# Patient Record
Sex: Female | Born: 1950
Health system: Southern US, Community
[De-identification: ages and names within clinical notes are randomized; demographics above are authoritative.]

## PROBLEM LIST (undated history)

## (undated) DIAGNOSIS — T7840XA Allergy, unspecified, initial encounter: Secondary | ICD-10-CM

## (undated) DIAGNOSIS — F32A Depression, unspecified: Secondary | ICD-10-CM

## (undated) DIAGNOSIS — K589 Irritable bowel syndrome without diarrhea: Secondary | ICD-10-CM

## (undated) DIAGNOSIS — K635 Polyp of colon: Secondary | ICD-10-CM

## (undated) DIAGNOSIS — F419 Anxiety disorder, unspecified: Secondary | ICD-10-CM

## (undated) DIAGNOSIS — M1990 Unspecified osteoarthritis, unspecified site: Secondary | ICD-10-CM

## (undated) DIAGNOSIS — F329 Major depressive disorder, single episode, unspecified: Secondary | ICD-10-CM

## (undated) DIAGNOSIS — K5790 Diverticulosis of intestine, part unspecified, without perforation or abscess without bleeding: Secondary | ICD-10-CM

## (undated) DIAGNOSIS — I213 ST elevation (STEMI) myocardial infarction of unspecified site: Secondary | ICD-10-CM

## (undated) DIAGNOSIS — K219 Gastro-esophageal reflux disease without esophagitis: Secondary | ICD-10-CM

## (undated) HISTORY — PX: PARTIAL HYSTERECTOMY: SHX80

## (undated) HISTORY — PX: CHOLECYSTECTOMY: SHX55

## (undated) HISTORY — DX: Depression, unspecified: F32.A

## (undated) HISTORY — DX: Major depressive disorder, single episode, unspecified: F32.9

## (undated) HISTORY — DX: Polyp of colon: K63.5

## (undated) HISTORY — DX: Diverticulosis of intestine, part unspecified, without perforation or abscess without bleeding: K57.90

## (undated) HISTORY — DX: Gastro-esophageal reflux disease without esophagitis: K21.9

## (undated) HISTORY — DX: Allergy, unspecified, initial encounter: T78.40XA

## (undated) HISTORY — PX: UPPER GASTROINTESTINAL ENDOSCOPY: SHX188

## (undated) HISTORY — DX: Irritable bowel syndrome, unspecified: K58.9

## (undated) HISTORY — PX: COLONOSCOPY: SHX174

## (undated) HISTORY — DX: Unspecified osteoarthritis, unspecified site: M19.90

## (undated) HISTORY — PX: KNEE ARTHROSCOPY: SUR90

## (undated) HISTORY — DX: Anxiety disorder, unspecified: F41.9

## (undated) HISTORY — PX: BACK SURGERY: SHX140

---
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
## 1998-10-19 ENCOUNTER — Ambulatory Visit (HOSPITAL_COMMUNITY): Admission: RE | Admit: 1998-10-19 | Discharge: 1998-10-19 | Payer: Self-pay

## 1999-10-23 ENCOUNTER — Other Ambulatory Visit: Admission: RE | Admit: 1999-10-23 | Discharge: 1999-10-23 | Payer: Self-pay | Admitting: Internal Medicine

## 1999-10-23 ENCOUNTER — Encounter (INDEPENDENT_AMBULATORY_CARE_PROVIDER_SITE_OTHER): Payer: Self-pay | Admitting: Specialist

## 1999-11-15 ENCOUNTER — Other Ambulatory Visit: Admission: RE | Admit: 1999-11-15 | Discharge: 1999-11-15 | Payer: Self-pay | Admitting: Gynecology

## 1999-11-28 ENCOUNTER — Emergency Department (HOSPITAL_COMMUNITY): Admission: EM | Admit: 1999-11-28 | Discharge: 1999-11-28 | Payer: Self-pay | Admitting: Emergency Medicine

## 1999-11-28 ENCOUNTER — Encounter: Payer: Self-pay | Admitting: General Surgery

## 1999-11-30 ENCOUNTER — Encounter: Payer: Self-pay | Admitting: General Surgery

## 1999-11-30 ENCOUNTER — Observation Stay (HOSPITAL_COMMUNITY): Admission: EM | Admit: 1999-11-30 | Discharge: 1999-12-01 | Payer: Self-pay | Admitting: Obstetrics and Gynecology

## 2001-01-22 ENCOUNTER — Encounter: Payer: Self-pay | Admitting: Neurosurgery

## 2001-01-22 ENCOUNTER — Inpatient Hospital Stay (HOSPITAL_COMMUNITY): Admission: RE | Admit: 2001-01-22 | Discharge: 2001-01-25 | Payer: Self-pay | Admitting: Neurosurgery

## 2001-02-13 ENCOUNTER — Encounter: Payer: Self-pay | Admitting: Neurosurgery

## 2001-02-13 ENCOUNTER — Encounter: Admission: RE | Admit: 2001-02-13 | Discharge: 2001-02-13 | Payer: Self-pay | Admitting: Neurosurgery

## 2001-05-27 ENCOUNTER — Other Ambulatory Visit: Admission: RE | Admit: 2001-05-27 | Discharge: 2001-05-27 | Payer: Self-pay | Admitting: Family Medicine

## 2001-12-28 ENCOUNTER — Encounter: Payer: Self-pay | Admitting: Internal Medicine

## 2004-03-27 ENCOUNTER — Other Ambulatory Visit: Admission: RE | Admit: 2004-03-27 | Discharge: 2004-03-27 | Payer: Self-pay | Admitting: Family Medicine

## 2008-08-08 ENCOUNTER — Telehealth: Payer: Self-pay | Admitting: Internal Medicine

## 2008-08-09 ENCOUNTER — Ambulatory Visit: Payer: Self-pay | Admitting: Gastroenterology

## 2008-08-09 DIAGNOSIS — K219 Gastro-esophageal reflux disease without esophagitis: Secondary | ICD-10-CM

## 2008-08-09 DIAGNOSIS — R1310 Dysphagia, unspecified: Secondary | ICD-10-CM | POA: Insufficient documentation

## 2008-08-09 DIAGNOSIS — K589 Irritable bowel syndrome without diarrhea: Secondary | ICD-10-CM

## 2008-08-09 DIAGNOSIS — R198 Other specified symptoms and signs involving the digestive system and abdomen: Secondary | ICD-10-CM | POA: Insufficient documentation

## 2008-08-09 DIAGNOSIS — R109 Unspecified abdominal pain: Secondary | ICD-10-CM

## 2008-08-09 DIAGNOSIS — R197 Diarrhea, unspecified: Secondary | ICD-10-CM

## 2008-08-23 ENCOUNTER — Telehealth: Payer: Self-pay | Admitting: Internal Medicine

## 2008-08-23 ENCOUNTER — Ambulatory Visit: Payer: Self-pay | Admitting: Internal Medicine

## 2008-08-23 ENCOUNTER — Encounter: Payer: Self-pay | Admitting: Internal Medicine

## 2008-08-26 ENCOUNTER — Encounter: Payer: Self-pay | Admitting: Internal Medicine

## 2008-12-05 ENCOUNTER — Encounter (INDEPENDENT_AMBULATORY_CARE_PROVIDER_SITE_OTHER): Payer: Self-pay | Admitting: *Deleted

## 2009-02-02 ENCOUNTER — Telehealth: Payer: Self-pay | Admitting: Internal Medicine

## 2009-02-02 DIAGNOSIS — Z860100 Personal history of colon polyps, unspecified: Secondary | ICD-10-CM | POA: Insufficient documentation

## 2009-02-02 DIAGNOSIS — K5730 Diverticulosis of large intestine without perforation or abscess without bleeding: Secondary | ICD-10-CM | POA: Insufficient documentation

## 2009-02-03 ENCOUNTER — Ambulatory Visit: Payer: Self-pay | Admitting: Gastroenterology

## 2009-02-03 DIAGNOSIS — R1032 Left lower quadrant pain: Secondary | ICD-10-CM | POA: Insufficient documentation

## 2009-02-03 LAB — CONVERTED CEMR LAB
Basophils Absolute: 0.1 10*3/uL (ref 0.0–0.1)
Bilirubin Urine: NEGATIVE
Eosinophils Absolute: 0.2 10*3/uL (ref 0.0–0.7)
HCT: 40.9 % (ref 36.0–46.0)
Hemoglobin: 13.7 g/dL (ref 12.0–15.0)
Ketones, ur: NEGATIVE mg/dL
Leukocytes, UA: NEGATIVE
Lymphs Abs: 4.4 10*3/uL — ABNORMAL HIGH (ref 0.7–4.0)
MCHC: 33.4 g/dL (ref 30.0–36.0)
Neutro Abs: 5.4 10*3/uL (ref 1.4–7.7)
RDW: 12.8 % (ref 11.5–14.6)
Urobilinogen, UA: 0.2 (ref 0.0–1.0)

## 2010-05-10 ENCOUNTER — Telehealth: Payer: Self-pay | Admitting: Internal Medicine

## 2010-05-17 ENCOUNTER — Encounter: Payer: Self-pay | Admitting: Physician Assistant

## 2010-05-17 ENCOUNTER — Ambulatory Visit (INDEPENDENT_AMBULATORY_CARE_PROVIDER_SITE_OTHER): Payer: 59 | Admitting: Physician Assistant

## 2010-05-17 DIAGNOSIS — K5900 Constipation, unspecified: Secondary | ICD-10-CM | POA: Insufficient documentation

## 2010-05-17 DIAGNOSIS — K5732 Diverticulitis of large intestine without perforation or abscess without bleeding: Secondary | ICD-10-CM

## 2010-05-17 DIAGNOSIS — F411 Generalized anxiety disorder: Secondary | ICD-10-CM | POA: Insufficient documentation

## 2010-05-17 DIAGNOSIS — K589 Irritable bowel syndrome without diarrhea: Secondary | ICD-10-CM

## 2010-05-17 DIAGNOSIS — K219 Gastro-esophageal reflux disease without esophagitis: Secondary | ICD-10-CM

## 2010-05-18 ENCOUNTER — Telehealth: Payer: Self-pay | Admitting: Physician Assistant

## 2010-05-22 NOTE — Progress Notes (Signed)
Summary: Needs help to get Nexium  Phone Note Call from Patient Call back at Work#[phone] or Cell [phone]   Call For: Dr Juanda Chance Reason for Call: Refill Medication Summary of Call: Has been short of money so her last Nexium refill she only got 15 pills-that was all she could afford. But today when she went to get her refills she says her pharmacy told her she can ONLY do a 90 day supply. she just can not afford that at this time. She says if they had warned her she could of saved up for it. Now she has no pills and cant afford the 90days and really needs medicines. wonder sif we can help her out in any way? Initial call taken by: Leanor Kail [facility],  May 10, 2010 9:37 AM  Follow-up for Phone Call        Attempted work number, it is busy. Attempted cell number. No answer. Left voicemail for patient to call back. Dottie Nelson-Smith CMA Duncan Dull)  May 10, 2010 11:43 AM   Contacted patient @ number provided by female [phone]). Advised patient that we will be glad to provide a limited number of samples of Nexium for her but that if the prescription continues to be too expensive, she will need to call her insurance company to find out what PPI's they prefer and we can send one of those medications for her. Patient states that she has been on the medication for so long, that her and Dr Juanda Chance decided that she should not take any other medications. Again, advised patient that we could not provide samples continuously so she may need to look into alternatives if needed. Patient states that she would like to be "worked in" to Dr Kimberly-Clark schedule Thursday or Friday when she is in town due to constant pain. I have advised her that Dr Juanda Chance is not in the office either of those days but that one of our nurses will discuss our symptoms with her to decide if she needs a sooner appointment than what she already has. samples have been placed at front desk. Follow-up by: Lamona Curl CMA  Duncan Dull),  May 14, 2010 3:35 PM  Additional Follow-up for Phone Call Additional follow up Details #1::        Patient is c/o LLQ tenderness and constipation.  She was treated in 2010 for the same symptoms for diverticulitis.  She states she has a 2 week hx of worsening constipation, gas and LLQ tenderness.  She is not available to be seen until Thursday, she is scehduled to see Mike Gip PA on 05/17/10 10:30 Additional Follow-up by: Darcey Nora RN, CGRN,  May 14, 2010 3:47 PM    New/Updated Medications: NEXIUM 40 MG CPDR (ESOMEPRAZOLE MAGNESIUM) Take 1 tab daily

## 2010-05-22 NOTE — Assessment & Plan Note (Signed)
Summary: Jasmine Stephens   History of Present Illness Visit Type: Follow-up Visit Primary GI MD: Lina Sar MD Primary Provider: Gweneth Dimitri, MD Chief Complaint: Jasmine abdominal pain and tenderness/Stephens History of Present Illness:   PLEASANT 60 YO FEMALE KNOWN TO DR. Juanda Chance WITH HX OF GERD, IBS, DIVERTICULOSIS, COLON POLYPS AND FAMILY HX OF COLON CANCER. SHE WAS LAST SEEN IN 11/10  WITH DIVERTICULITIS. LAST COLONOSCOPY 6/10, ONE POLYP (BENIGN TISSUE).  SHE COMES IN TODAY WITH C/O ONGOING FAIRLY GENERALIZED ABDOMINAL DISCOMFORT, BLOATING, INTERMITTENT CRAMPING AND Stephens  OVER THE PAST FEW MONTHS. SHE HAS ALSO BEEN OFF HER NEXIUM  OVER THE PAST FEW MONTHS BECAUSE HER INSURANCE REQUIRES SHE GET 3 MONTHS WORTH AT A TIME AND IT IS DIFFICULT FOR HER TO COME UP WITH  $120 AT A TIME.  SHE HAS NOTICED MORE LEFT LOWER ABDOMINAL PAIN. NO FEVER. NO MELENA OR HEME.SHE STAYS CONSTIPATED-SYAS SHE HAS SMALL BOWEL MOVEMENTS BUT NEVER FEELS LIKE SHE EVACUATES HER BOWELS. SHE HAS FOUND ALIGN AND ROBINUL HELPFUL IN THE PAST.    GI Review of Systems    Reports abdominal pain.     Location of  Abdominal pain: Jasmine.    Denies acid reflux, belching, bloating, chest pain, dysphagia with liquids, dysphagia with solids, heartburn, loss of appetite, nausea, vomiting, vomiting blood, weight loss, and  weight gain.      Reports change in bowel habits and  Stephens.     Denies anal fissure, black tarry stools, diarrhea, diverticulosis, fecal incontinence, heme positive stool, hemorrhoids, irritable bowel syndrome, jaundice, light color stool, liver problems, rectal bleeding, and  rectal pain. Current Medications (verified): 1)  Nexium 40 Mg Cpdr (Esomeprazole Magnesium) .... Take 1 Tab Daily To 1 Every Other Day 2)  Aleve 220 Mg Tabs (Naproxen Sodium) .Marland Kitchen.. 1 By Mouth Once Daily As Needed 3)  Nexium 40 Mg Cpdr (Esomeprazole Magnesium) .... Take 1 Cap 30 Min Prior To Breakfast 4)   Align 4 Mg Caps (Probiotic Product) .... Take 1 Capsule Daily X 4 Weeks 5)  Cipro 500 Mg Tabs (Ciprofloxacin Hcl) .... Take 1 Tab Twice Daily X 10 Days  Allergies (verified): 1)  ! Penicillin V Potassium (Penicillin V Potassium) 2)  ! Chlorpromazine Hcl (Chlorpromazine Hcl) 3)  ! * Phenothiazine  Past History:  Past Medical History: Irritable bowel syndrome GERD DIVERTICULOSIS COLON POLYPS Anxiety/DEPRESSION  Past Surgical History: Cholecystectomy 2001 Partial hysterectomy 1983 Back surgery 2003 Rt knee surgery 1992 COLONOSCOPY 6/10 EGD 6/10  Family History: Reviewed history from 02/03/2009 and no changes required. Colon cancer-paternal grandmother, aunt Family History of Heart Disease: Mother and Father Conjestive heart failure-Mother MI-Brother, Father, Paternal grandfather  Social History: Reviewed history from 08/09/2008 and no changes required. Occupation:  WORKING [employer] [place] [employer] CUR[employer] Patient has never smoked.  Alcohol Use - yes- rare glass of wine Daily Caffeine Use- coffee, tea Illicit Drug Use - no Patient does not get regular exercise.   Review of Systems       The patient complains of arthritis/joint pain, back pain, and cough.  The patient denies allergy/sinus, anemia, blood in urine, breast changes/lumps, change in vision, confusion, coughing up blood, depression-new, fainting, fatigue, fever, headaches-new, hearing problems, heart murmur, heart rhythm changes, itching, muscle pains/cramps, night sweats, pregnancy symptoms, shortness of breath, sore throat, swelling of feet/legs, swollen lymph glands, thirst - excessive, urination - excessive, vision changes, and voice change.         SEE HPI  Vital Signs:  Patient profile:   60 year old female Height:      66 inches Weight:      212 pounds BMI:     34.34 Pulse rate:   88 / minute Pulse rhythm:   regular BP sitting:   136 / 78  (left arm)  Vitals Entered By: Milford Cage NCMA  (May 17, 2010 10:44 AM)  Physical Exam  General:  Well developed, well nourished, no acute distress. Head:  Normocephalic and atraumatic. Eyes:  PERRLA, no icterus. Lungs:  Clear throughout to auscultation. Heart:  Regular rate and rhythm; no murmurs, rubs,  or bruits. Abdomen:  SOFT, BS+, TENDER Jasmine, AND MILD DIFFUSLEY, NO GUARDING, NO REBOUND, NO MASS OR HSM Rectal:  NOT DONE Extremities:  No clubbing, cyanosis, edema or deformities noted. Neurologic:  Alert and  oriented x4;  grossly normal neurologically. Psych:  Alert and cooperative. Normal mood and affect.anxious.    Impression & Recommendations:  Problem # 1:  ABDOMINAL PAIN-Jasmine (ICD-789.04) Assessment Deteriorated 60 YO FEMALE WITH IBS AND DIVERTICULOSIS WITH SEVERAL WEEK HX OF Jasmine PAIN/TENDERNESS. CHRONIC Stephens, BLOATING. MAY HAVE LOW GRADE DIVERTICULITIS-WILL TREAT WITH CIPRO 500 MG TWICE DAILY X 10 DAYS. DEFINITE IBS OVERLAY- START ALIGN ONE DAILY REFILL ROBINUL FORTE 2 MG 1-2 X DAILY AS NEEDED FOR CRAMPING ADVISED BOWEL PURGE WITH MIRALAX 17 GM-PT TO TAKE 4-5 DOSES OVER 2 HOURS THIS WEEKEND  FOLLOW UP WITH DR. Juanda Chance AS NEEDED  Problem # 2:  ADENOCARCINOMA, COLON, FAMILY HX (ICD-V16.0) Assessment: Comment Only PT DUE FOR COLONOSCOPY 2015  Problem # 3:  COLONIC POLYPS, HYPERPLASTIC, HX OF (ICD-V12.72) Assessment: Comment Only SEE ABOVE  Problem # 4:  GERD (ICD-530.81) Assessment: Improved LESS SYMPTOMATIC SINCE OFF SEVERAL PSYCHOTROPICS  REFILL NEXIUM 40 MG MAY TAKE DAILY OR EVERY OTHER DAY.  Problem # 5:  ANXIETY (ICD-300.00) Assessment: Comment Only  Patient Instructions: 1)  We have sent a prescription for [facility]ipro  to [facility] [place], [place]. 2)  We gave you samples of Align probiotic to take 1 daily x 4 weeks. 3)  We have given you samples of Nexium 40 mg.  4)  We have given you samples of Miralax. Choose a day this weekend that you  can take the Miralax, 4-5 doses. 5)  Put the packet in a glass of  water or juice, gatorade, and drink 1 glass every 15 min.  Do 5-6 packets. 6)  F/U with Dr. Juanda Chance as needed. 7)  Copy sent to : Selena Batten, MD 8)  The medication list was reviewed and reconciled.  All changed / newly prescribed medications were explained.  A complete medication list was provided to the patient / caregiver.  Prescriptions: ROBINUL-FORTE 2 MG TABS (GLYCOPYRROLATE) Take 1 tab twice daily as needed for cramping and spasms  #60 x 1   Entered by:   Lowry Ram NCMA   Authorized by:   Sammuel Cooper PA-c   Signed by:   Lowry Ram NCMA on 05/17/2010   Method used:   [facility] to        [facility]  [address] [id number]       [address]       [place], [place]  [address]       Ph: [phone] or [phone]       Fax: [phone]   RxID:   [id number] CIPRO 500 MG TABS (CIPROFLOXACIN HCL) Take 1 tab twice daily x 10  days  #20 x 0   Entered by:   Lowry Ram NCMA   Authorized by:   Sammuel Cooper PA-c   Signed by:   Lowry Ram NCMA on 05/17/2010   Method used:   [facility]        [facility]  [address] [id number]       [address]       [place], [place]  [address]       Ph: [phone] or [phone]       Fax: [phone]   RxID:   [id number]

## 2010-05-22 NOTE — Progress Notes (Signed)
Summary: Questions  Phone Note Call from Patient Call back at Home Phone [phone] [facility]   Call back at [phone]   Caller: Patient Call For: Mike Gip Reason for Call: Talk to Nurse Summary of Call: Patient has questions about the instructions that you went over with her yesterday Initial call taken by: [place] Johnson,  May 18, 2010 10:13 AM    Additional Follow-up for Phone Call Additional follow up Details #2::    Pt wanted to know if she can eat the day she plans on doing the bowel purge that evening.  I told her she can eat as normal and do the bowlel purge some evening she is goint to be home. I explained again to drink the juice with packet of Miralax we gave her every 15 min , 6 doses.  She said she understood.  I asked her to call us after she does the bowel purge.  Follow-up by: Joselyn Glassman,  May 18, 2010 4:27 PM

## 2010-08-10 NOTE — H&P (Signed)
. [facility]  Patient:    Jasmine Stephens, Jasmine Stephens Visit Number: [id number] MRN: [id number]          Service Type: SUR Location: [id number] Attending Physician:  Barton Fanny Dictated by:   Hewitt Shorts, M.D. Admit Date:  01/22/2001                           History and Physical  HISTORY OF PRESENT ILLNESS:  Patient is a 60 year old right-handed white female who is evaluated for low back and bilateral lower extremity pain secondary to L5-S1 lumbar disk herniation associated with spondylosis and degenerative disk disease.  She has had difficulties with her low back over the past several years.  Current difficulties began about three months ago; it may have begun after a fall in late August.  She has been having difficulties with low back pain and increasing discomfort extending into her lower extremities bilaterally - right worse than left.  She has a lot of discomfort in bed while turning in bed and getting up from bed.  She also finds that she has discomfort and difficulty getting up from a seated position.  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  She is sitting in chairs intended for preschools which are small and low to the ground.  Her symptoms have steadily worsened, particularly for the past month-and-a-half.  She complains of pain in the low back into her lower extremities - right worse than left.  She says the pain is worse in the back than in the lower extremities.  She has some numbness and tingling of the lower extremities in both legs and feet.  She does not describe any specific weakness.  She does describe another problem with numbness and tingling occasionally in the right hand and forearm.  It will occasionally wake her up and she notices it when she is waking up and also when she is writing or using the computer.  She has been treated with  a number of medications including Celebrex, Mobic, Vicodin, and Flexeril, but none of these have really given her any substantial relief.  Patient was studied with MRI scan and neurosurgical evaluation was performed. The patient is now admitted for an L5-S1 decompression and arthrodesis.  PAST MEDICAL HISTORY:  Notable for history of depression, anxiety, and stress. Also, she has hiatal hernia, irritable bowel syndrome, and spastic colon.  She has been followed by Dr. Lina Sar and has had a number of colonoscopies. She does not describe any history of hypertension, myocardial infarction, cancer, stroke, diabetes, or lung disease.  PREVIOUS SURGERY:  Cholecystectomy in September 2001.  Hysterectomy in December 1994 - she underwent a partial vaginal hysterectomy but no oophorectomy.  She has undergone arthroscopic knee surgery in November 1991, and colonoscopies every three years.  ALLERGIES:  PENICILLIN as well as PHENOTHIAZINES.  CURRENT MEDICATIONS:  Wellbutrin, Mobic, Vicodin, Prilosec, Flexeril.  FAMILY HISTORY:  Her father died of heart disease.  Her mother is in fair health at age 25 with heart disease as well.  SOCIAL HISTORY:  Patient is married.  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  She has to travel up to eight schools and sometimes she can be at as many as five schools in a single day.  She carries equipment and supplies in from her  car to each of these schools.  She does not smoke, she drinks rarely.  She denies history of substance abuse.  REVIEW OF SYSTEMS:  Notable for those difficulties described in the history of present illness and past medical history.  She does describe some sinus disease, but her 14-point review of systems sheet is otherwise unremarkable.  PHYSICAL EXAMINATION:  GENERAL:  Patient is a well-developed, well-nourished white female in no acute distress.  VITAL SIGNS:  Temperature 97.4,  pulse 84, blood pressure 103/69, respiratory rate 20.  Height 5 feet 7 inches, weight 196 pounds.  LUNGS:  Clear to auscultation.  She has symmetric respiratory excursion.  HEART:  Regular rate and rhythm, normal S1, S2.  There is no murmur.  ABDOMEN:  Soft, nondistended.  Bowel sounds are present.  EXTREMITIES:  Shows no clubbing, cyanosis, or edema.  MUSCULOSKELETAL:  Shows tenderness to palpation in the lumbosacral junction. Her mobility is fairly good, both in flexion and extension.  Straight leg raising is negative bilaterally.  NEUROLOGIC:  Shows 5/5 strength through the lower extremities including the dorsiflexor, plantar flexor, extensor hallucis longus.  Sensation is intact to pinprick through the lower extremities.  Reflexes are trace to 1 at the quadriceps, minimal at the gastrocnemii; they are symmetric bilaterally.  Toes are downgoing bilaterally.  She has a normal gait and stance.  LABORATORY DATA:  MRI of the lumbar spine shows advanced degenerative disk disease and spondylosis at L5-S1 with desiccation of the disk, loss of disk space height, and modic changes adjacent to the L5-S1 disk level, and a significantly broad-based disk herniation, worse centrally and next most so to the right, and least most so to the left.  The remainder of her disk levels show no significant degenerative changes.  IMPRESSION:  Patient with progressively worsening back and bilateral lower extremity pain - right worse than left, with advanced degenerative disk disease and spondylosis with superimposed disk herniation at L5-S1 with resulting back pain and radiculopathy.  She is neurologically intact.  PLAN:  The patient will be admitted for a bilateral L5-S1 decompression, diskectomy, and posterior lumbar interbody fusion with Synthes Plus spacers and posterolateral arthrodesis with ______ instrumentation, bone graft, and ______.  We discussed the nature of surgery; alternatives to  surgery and typical length of surgery, hospital stay, and overall recuperation and limitations  postoperatively; the need for postoperative mobilization and lumbar corset. We plan to use a CellSaver during the surgery.  We discussed risks of surgery including risk of infection; bleeding; possible need for transfusion; the risk of nerve root dysfunction with pain, weakness, numbness, or paresthesias; the risk of dural tear and CSF leakage; possible need for further surgery; the risks of failure of the arthrodesis; and anesthetic risks of myocardial infarction, stroke, pneumonia, and death.  Understanding all this, she does wish to proceed with surgery and is admitted for such. Dictated by:   Hewitt Shorts, M.D. Attending Physician:  Barton Fanny DD:  01/22/01 TD:  01/22/01 Job: [id number]

## 2010-08-10 NOTE — Discharge Summary (Signed)
[place]  Patient:    PATTIANN, SOLANKI Visit Number: [id number] MRN: [id number]          Service Type: Attending:  Ronaldo Miyamoto L. Franky Macho, M.D. Dictated by:   Mena Goes. Franky Macho, M.D. Adm. Date:  01/22/01 Disc. Date: 01/25/01                             Discharge Summary  ADMITTING DIAGNOSIS:  Low back and bilateral lower extremity pain due to L5-S1 lumbar disc herniation associated with spondylosis and degenerative disc disease.  DISCHARGE DIAGNOSIS:  Low back and bilateral lower extremity pain due to L5-S1 lumbar disc herniation associated with spondylosis and degenerative disc disease.  PROCEDURE:  L5-S1 posterior lumbar antibody fusion with 9 mm Synthes spacers and posterolateral arthrodesis with 90 degrees instrumentation and local Autograft bone marrow aspiration.  COMPLICATIONS:  None.  CONDITION ON DISCHARGE:  Alive and well.  The wound is clean and dry without signs of infection.  HISTORY OF PRESENT ILLNESS:  Ms. Reginia Forts presented with a significant amount of low back and bilateral lower extremity pain of three months duration.  Pain did not improve with conservative measures.  MRI was studied and showed degenerative disc disease at L5-S1.  It was felt that after no improvement with conservative treatment, that she would do best with a bilateral L5-S1 decompression diskectomy and posterior lumbar antibody fusion using posterolateral arthrodesis also for augmentation.  HOSPITAL COURSE:  Ms. Reginia Forts was admitted and underwent an uncomplicated procedure and postoperatively has done quite well.  Her wound has remained clean and dry without signs of infection.  She has normal strength in the lower extremities.  She has been able to void, tolerate a regular diet and walk without great difficulty.  She will be discharged home today.  DISCHARGE MEDICATION:  Vicodin for pain.  FOLLOWUP:  She was instructed to call our office to obtain a return visit  with Dr. Shirlean Kelly.  SPECIAL INSTRUCTIONS:  The patient understands she is not to drive, lift, bend and/or twist.  She can be a passenger in a car, but not to drive for the very least two weeks.  She also understands that she will have some back pain as result of the operation which should improve with time. Dictated by:   Mena Goes. Franky Macho, M.D. Attending:  Mena Goes. Franky Macho, M.D. DD:  01/25/01 TD:  01/27/01 Job: [id number]

## 2010-08-10 NOTE — Op Note (Signed)
[place]  Patient:    Jasmine Stephens, Jasmine Stephens Visit Number: [id number] MRN: [id number]          Service Type: SUR Location: [id number] Attending Physician:  Barton Fanny Dictated by:   Hewitt Shorts, M.D. Proc. Date: 01/22/01 Admit Date:  01/22/2001                             Operative Report  PREOPERATIVE DIAGNOSIS:  L5-S1 lumbar disk herniation, spondylosis and degenerative disk disease.  POSTOPERATIVE DIAGNOSIS:  L5-S1 lumbar disk herniation, spondylosis, and degenerative disk disease.  OPERATION:  Bilateral L5-S1 lumbar laminotomy, facetectomy and microdiskectomy with microdissection.  Posterior lumbar interbody fusion with 9 mm Synthes PLIF spacers and posterior lateral arthrodesis with 90-D instrumentation, local morcellized autograft, Vitoss and bone marrow aspirates.  SURGEON:  Hewitt Shorts, M.D.  ASSISTANT:  Stefani Dama, M.D.  ANESTHESIA:  General endotracheal.  INDICATIONS: The patient is a 60 year old woman who presented with low back and bilateral lower extremity pain right worse than left who was found to have a central disk herniation at L5-S1 projecting slightly more right than to the left, superimposed upon advanced degenerative disk disease and spondylosis.  A decision was made to proceed with decompression and arthrodesis.  DESCRIPTION OF PROCEDURE: The patient was brought to the operating room and placed under general endotracheal anesthesia.  The patient was turned to a prone position.  The lumbar region was prepped with Betadine soap and solution and draped in sterile fashion.  The midline was infiltrated with a local anesthetic with epinephrine.  An x-ray was taken and the L5-S1 level was identified.  A midline incision was made over the L5-S1 level and carried down to the subcutaneous tissue.  Bipolar cautery and electrocautery were used to maintain hemostasis.  Dissection was carried down to  the lumbar fascia which was incised bilaterally and the paraspinal muscles were dissected from the paraspinous process and lamina in a subperiosteal fashion.  The L5-S1 level was identified with x-ray identification.  Bilateral laminotomies and fasciectomies were performed using Kerrison punches and double action rongeurs.  The ligament of flavum was carefully removed and the thecal sac and S1 nerve roots were identified bilaterally.  The microscope was then draped and brought onto the field to provide instant magnification, illumination and visualization.  The remainder of the decompression was performed using microdissection and microsurgical technique.  The thecal sac and nerve root were gently retracted medially exposing the degenerative disk protrusion with a central disk herniation.  The spondylitic overgrowth was removed bilaterally and then diskectomy performed with incision of the annulus and continued with pituitary rongeurs.  The central disk herniation was carefully removed using a variety of curets and pituitary rongeurs and osteophytic overgrowth in the posterior aspects of the vertebral bodies at the level of the disk space were removed.  We entered into the disk space.  It was markedly spondylolytic.  The end plates were curetted using the box curets and cup curets. We then placed a disk space spreader and gently distracted the disk space and then placed a 9 mm spacer which fit snuggly.  We selected a 9 mm Synthes spacer.  The end plates were fully prepared and then the PLIF spacers was tamped into the position and countersunk.  The distractor was then removed.  The end plate on the corresponding side was then curetted and prepared, and then another 9 mm  spacer placed and countersunk.  We then proceeded with the posterior lateral arthrodesis.  The pedicle entry sites for L5 and S1 were identified bilaterally.  The ala of the sacrum and the transverse process of L5 were  cleaned of soft tissue and decorticated. Using pedicle probes, we were able to enter into the pedicles of L5 and S1 bilaterally.  An x-ray was taken to confirm our positioning.  The pedicles were then tapped with a 5.25 tap.  A ball probe was used to check that no cut outs were present and that good threading was noted.  We then placed 6.75 x 40 mm screws at each level bilaterally.  We selected a 40 mm rod.  It was gently contoured to the lumbar sacral lordosis.  Prior to placing the rod and securing it, the posterior lateral gutter along the intertransversed space was packed with Vitoss that had been soaked in bone marrow aspirate that had been aspirated through the pedicle.  We then placed the rods and locking caps and these were then tightened with a counter torque wrench.  We then took the morcellized autograft and packed it in the intervertebral disk space between the PLIF spaces and lateral to them on either side.  The bone graft was tamped into the disk space and firmly packed.  Once all of the bone graft was packed in place, the wound was irrigated with saline solution and Bacitracin solution.  Once hemostasis was established and confirmed we proceeded with the closure.  The deep fascia was closed with interrupted undyed 1 Vicryl suture.  The subcutaneous tissue was closed with interrupted inverted undyed 1 Vicryl sutures and then the subcutaneous tissue was closed with interrupted 2-0 undyed Vicryl sutures and the skin was reapproximated with Dermabond.  The patient tolerated the procedure well.  The estimated blood loss was 450 cc. Sponge and needle counts were correct. Although we did use a Cell Saver the perfusionist did not feel there was sufficient blood loss within the collection canister to warrant spinning the collection down.  Subsequently, the patient was turned back to the supine position to be reversed from the anesthetic, extubated.  She was transferred to the  recovery room for further care where she was noted to be moving all four extremities. Dictated by:   Hewitt Shorts, M.D. Attending Physician:  Barton Fanny  DD:  01/22/01 TD:  01/22/01 Job: [id number]

## 2010-08-10 NOTE — Op Note (Signed)
[facility]  Patient:    Jasmine Stephens, Jasmine Stephens                         MRN: [id number] Proc. Date: 11/30/99 Adm. Date:  [id number] Disc. Date: [id number] Attending:  Miguel Aschoff                           Operative Report  PREOPERATIVE DIAGNOSES: 1. Pelvic pain. 2. Cholelithiasis.  PROCEDURE:  Diagnostic laparoscopy with lysis of adhesions.  SURGEON:  Miguel Aschoff, M.D.  ANESTHESIA:  General.  COMPLICATIONS:  None.  INDICATIONS:  The patient is a 60 year old white female with history of both pelvic pain as well as right upper quadrant pain.  The patient was diagnosed with having cholelithiasis and is to undergo a laparoscopic cholecystectomy by Dr. Zachery Dakins.  At the time of this laparoscopy, it was decided to proceed with diagnostic pelvic laparoscopy to see if any etiology for the pelvic pain could be established and corrected.  DESCRIPTION OF PROCEDURE:  The patient was taken to the operating room and placed in the supine position, and general anesthesia was administered without difficulty.  She was then placed in the dorsal lithotomy position and prepped and draped in the usual sterile fashion.  Bladder was catheterized.  Sponge stick was placed in the vaginal vault to allow manipulation of the apex of the cuff.  A small infraumbilical incision was made.  A Veress needle was inserted, and the abdomen was insufflated with three liters of CO2.  Following insufflation, the trocar-laparoscope was placed followed by laparoscope itself.  A 5 mm suprapubic port was then established under direct visualization.  Systematic inspection of the pelvic structure showed the uterus to be absent due to prior hysterectomy.  The right ovary was visualized and was noted to be totally within normal limits.  No adhesions were noted. The appendix was visualized and appeared to be totally within normal limits. On the left side, the left ovary had adhesions of the appendices  epicloica of the sigmoid colon to both the ovary and tube.  It was possible to place these adhesions on stretch and take them down sharply.  This was done with good hemostasis.  There were no other abnormalities noted on the left ovary or tube and no other abnormalities noted in the pelvis.  The only other finding was some filmy adhesions of the ascending colon to the lateral pelvic sidewall, and lysed these adhesions sharply.  At this point, with no other pelvic pathology noted, the case was turned over to Dr. Zachery Dakins who proceeded to perform a laparoscopic cholecystectomy.  This operative note is dictated as a separate report. DD:  11/30/99 TD:  12/02/99 Job: [id number]

## 2010-08-10 NOTE — Op Note (Signed)
[facility]  Patient:    Jasmine Stephens, Jasmine Stephens                         MRN: [id number] Proc. Date: 11/30/99 Adm. Date:  [id number] Disc. Date: [id number] Attending:  Miguel Aschoff                           Operative Report  PREOPERATIVE DIAGNOSIS:  Chronic cholecystitis and chronic lower abdominal pain.  POSTOPERATIVE DIAGNOSIS:  Chronic cholecystitis and chronic lower abdominal pain.  OPERATION:  Laparoscopic cholecystectomy with cholangiogram, followed by Dr. Miguel Aschoff with gynecologic and pelvic exploration, and lysis of adhesions under general anesthesia.  The surgeon for cholecystectomy was Dr. Zachery Dakins and assistant ________.  SURGEON:  Anselm Pancoast. Zachery Dakins, M.D.  ANESTHESIA:  General.  INDICATIONS:  The patient is a 60 year old Caucasian female who I personally met from the patients standpoint approximately two days ago when she presented to the emergency room because of increasing lower and mid abdominal pain, and had been evaluated by Dr. Tenny Craw and others.  She had had a pelvic ultrasound.  She had had a vaginal hysterectomy years before and on ultrasound, there was the question of other cysts in the left ovary and Dr. Tenny Craw on evaluation of the abdominal pain, the ultrasound people had checked the gallbladder and noted stones, and she was supposed to see one of our physicians in the office, but because of increasing pain, came to the emergency room on Wednesday and I was on call.  I saw her, at which time, she was not no acutely in pain as far as elevated fever or right upper quadrant-type pain, and I was in agreement that she definitely had gallstones, but it is felt that the pain which was a little lower than the typical gallbladder attack certainly was a chronic nature, and since Dr. Tenny Craw had planned to do a laparoscopic evaluation, I contacted him and we were able to rearrange her schedule to do her today.  The patient had a problem with chronic  constipation and was given laxatives and has cleaned out her colon over the past 24 hours.  Preoperatively, she was given 400 mg of Cipro being allergic to penicillin, and no repeat labs were obtained.  DESCRIPTION OF PROCEDURE:  The patient was taken to surgery and the laparoscopic pelvic evaluation was performed first.  Dr. Tenny Craw prepped and she was in the ________ stirrups and he introduced the cannula and the Veress needle at the umbilicus, and did the laparoscopic evaluation.  She basically had very few pelvic adhesions.  There was one adhesion over the left that he took, and he will dictate that portion of the procedure.  The appendix was normal by inspection, laying low in the right pelvis, and there were adhesions up around the gallbladder and it was also noted that her gallbladder really lied much lower than a typical gallbladder, and most likely, this chronic pain that she is having has been biliary colic all along, just in a lower position. She had kind of localized the pains sort of to the right of the umbilicus, not really truly in the pelvis, but certainly not in the right upper quadrant either.  With him completing his portion of the procedure, and I scrubbed in, and we elected to basically put her feet together, but not truly take her out of the stirrups, and in this position on the OR table.  We then using the port that he had already placed to visualize the upper abdomen and under direct vision after anesthetizing the fascia, I used the 20 mm trocar of the upper since he had used the 10 at the umbilicus.  The right upper quadrant small trocars were placed under direct vision after anesthetizing the fascia.  There were numerous adhesions up around the gallbladder that were carefully taken down and the gallbladder retracted upward and outward.  We dissected out the proximal portion of the gallbladder and the cystic duct.  It was kind of long. Normal size cystic duct with a  quite dilated gallbladder and the clip was placed with the flush of the cystic duct and the gallbladder, and then a Taut catheter was introduced into the cystic duct, and a cholangiogram was obtained.  We could position her on the OR table slipped all the way down for a good cholangiogram which showed prompt filling into the duodenum.  No evidence of any intrahepatic dilatation or common bile duct stones.  Next, the Taut catheter was removed and the cystic duct was triply clipped and divided.  The cystic artery had been freed up, doubly clipped proximally, singly distally, and divided, and then the gallbladder was freed from its bed using the hook electrocautery.  Good hemostasis was obtained and then I placed the gallbladder in an Endocatch bag.  Her stones are not very large and I elected to go ahead and remove the gallbladder from the upper 12 mm port in the Endocatch bag, and it slipped through the fascia without problems.  Then, using an Endoclose stitch and also 2-0 Vicryl, I basically closed this upper 12 mm port, both with an Endoclose single stitch, and then also a stitch in the fascia under direct vision.  I reinspected all ports sites and no evidence of bleeding.  The gallbladder bed was dry and we basically removed the two 5 mm ports, removed the port, but not the ________ above the bladder and then releasing the carbon dioxide from the peritoneal cavity.  I then closed the fascia at the umbilicus with a figure-of-eight of 0 Vicryl also.  The fascia had been anesthetized and the subcutaneous wounds were closed with 4-0 Vicryl, and then Benzoin and Steri-Strips on the skin.  The patient tolerated the procedure nicely and was sent to the recovery room in stable postoperative condition.  She is a 24 evaluation and should be able to go home tomorrow, and hopefully this chronic recurrent mid lower abdominal pain will be resolved. DD:  11/30/99 TD:  12/02/99 Job:  [id number]

## 2010-08-22 ENCOUNTER — Telehealth: Payer: Self-pay | Admitting: Internal Medicine

## 2010-08-22 NOTE — Telephone Encounter (Signed)
Patient calling to report she got sick on her stomach yesterday and vomited all day long. No vomiting today. States she has "pressure all over her abdomen below the belly button." States she has a headache and gets hot then cold. States she has had diarrhea today. She has not taken anything for the nausea or diarrhea because she is allergic to chlorpromazine. States she was told not to take anything for diarrhea because of this allergy. She is drinking ginger ale. Encouraged patient to continue with clear liquids and to force fluids. She is taking her Nexium and will get her Robinul-forte refilled(states she left it at [facility]). Patient to call back if she is not feeling better. Hx- GERD, IBS, diverticulosis, colon polyps, family hx colon cancer. Last colon/EGD 08/23/08.

## 2010-08-22 NOTE — Telephone Encounter (Signed)
May call in Zofran 4 mg po q 6 hrs prn, #20, 1 refill

## 2010-08-23 MED ORDER — ONDANSETRON HCL 4 MG PO TABS: ORAL_TABLET | ORAL | Status: DC

## 2010-08-23 NOTE — Telephone Encounter (Signed)
Patient given Dr. Brodie's recommendation. Rx sent to pharmacy 

## 2010-11-12 ENCOUNTER — Other Ambulatory Visit: Payer: Self-pay | Admitting: Internal Medicine

## 2010-11-12 MED ORDER — ESOMEPRAZOLE MAGNESIUM 40 MG PO CPDR
40.0000 mg | DELAYED_RELEASE_CAPSULE | Freq: Every day | ORAL | Status: DC
Start: 2010-11-12 — End: 2011-12-01

## 2010-11-12 NOTE — Telephone Encounter (Signed)
Advised patient that I dont see where a script was ever sent to the pharmacy. From what I can see, only samples were given. She states that she has been taking the rx. New rx sent to pharmacy. Advised patient she will need follow up visit with Dr Juanda Chance in 04-2011 for further refills.

## 2010-11-15 ENCOUNTER — Telehealth: Payer: Self-pay | Admitting: Internal Medicine

## 2010-11-15 NOTE — Telephone Encounter (Signed)
Patient needs rx to be 90 days script. I have contacted [facility] at [facility] [place], [place] and have okayed #90 with 1 refill (= to the original 6 month rx). She will make changes on script.

## 2011-07-18 ENCOUNTER — Other Ambulatory Visit: Payer: Self-pay | Admitting: Family Medicine

## 2011-07-18 DIAGNOSIS — N644 Mastodynia: Secondary | ICD-10-CM

## 2011-07-25 ENCOUNTER — Ambulatory Visit
Admission: RE | Admit: 2011-07-25 | Discharge: 2011-07-25 | Disposition: A | Discharge status: Home / Self Care | Payer: 59 | Source: Ambulatory Visit | Attending: Family Medicine | Admitting: Family Medicine

## 2011-07-25 ENCOUNTER — Other Ambulatory Visit: Payer: Self-pay | Admitting: Family Medicine

## 2011-07-25 DIAGNOSIS — N644 Mastodynia: Secondary | ICD-10-CM

## 2011-10-08 ENCOUNTER — Telehealth: Payer: Self-pay | Admitting: Internal Medicine

## 2011-10-08 NOTE — Telephone Encounter (Signed)
Patient c/o bilateral lower quad abdominal pain.  She has been taking robinul with relief at night, but not able to take it during the day because it makes her too sleepy.  She will come in and see Willette Cluster RNP tomorrow.  Last year had similar symptoms and was treated for diverticulitis.

## 2011-10-09 ENCOUNTER — Ambulatory Visit (INDEPENDENT_AMBULATORY_CARE_PROVIDER_SITE_OTHER): Payer: 59 | Admitting: Nurse Practitioner

## 2011-10-09 ENCOUNTER — Encounter: Payer: Self-pay | Admitting: Nurse Practitioner

## 2011-10-09 VITALS — BP 120/78 | HR 84 | Ht 66.0 in | Wt 208.6 lb

## 2011-10-09 DIAGNOSIS — K589 Irritable bowel syndrome without diarrhea: Secondary | ICD-10-CM

## 2011-10-09 DIAGNOSIS — R198 Other specified symptoms and signs involving the digestive system and abdomen: Secondary | ICD-10-CM

## 2011-10-09 DIAGNOSIS — R109 Unspecified abdominal pain: Secondary | ICD-10-CM

## 2011-10-09 MED ORDER — ALIGN PO CAPS: 1.0000 | ORAL_CAPSULE | Freq: Every day | ORAL | Status: AC

## 2011-10-09 MED ORDER — MOVIPREP 100 G PO SOLR: 1.0000 | Freq: Once | ORAL | Status: AC

## 2011-10-09 NOTE — Patient Instructions (Addendum)
Take Miralax twice daily until bowels start moving.  If you still have pain call us and we may order a CT scan. Reduce the Robinul Forte to once a day. We [facility] a prescription [facility] the [facility]rep to [facility].  We have given you samples or Align probiotic to take once daily for 14 days.  Colonoscopy A colonoscopy is an exam to evaluate your entire colon. In this exam, your colon is cleansed. A long fiberoptic tube is inserted through your rectum and into your colon. The fiberoptic scope (endoscope) is a long bundle of enclosed and very flexible fibers. These fibers transmit light to the area examined and send images from that area to your caregiver. Discomfort is usually minimal. You may be given a drug to help you sleep (sedative) during or prior to the procedure. This exam helps to detect lumps (tumors), polyps, inflammation, and areas of bleeding. Your caregiver may also take a small piece of tissue (biopsy) that will be examined under a microscope. LET YOUR CAREGIVER KNOW ABOUT:   Allergies to food or medicine.   Medicines taken, including vitamins, herbs, eyedrops, over-the-counter medicines, and creams.   Use of steroids (by mouth or creams).   Previous problems with anesthetics or numbing medicines.   History of bleeding problems or blood clots.   Previous surgery.   Other health problems, including diabetes and kidney problems.   Possibility of pregnancy, if this applies.  BEFORE THE PROCEDURE   A clear liquid diet may be required for 2 days before the exam.   Ask your caregiver about changing or stopping your regular medications.   Liquid injections (enemas) or laxatives may be required.   A large amount of electrolyte solution may be given to you to drink over a short period of time. This solution is used to clean out your colon.   You should be present 60 minutes prior to your procedure or as directed by your caregiver.  AFTER THE PROCEDURE   If you received a sedative or  pain relieving medication, you will need to arrange for someone to drive you home.   Occasionally, there is a little blood passed with the first bowel movement. Do not be concerned.  FINDING OUT THE RESULTS OF YOUR TEST Not all test results are available during your visit. If your test results are not back during the visit, make an appointment with your caregiver to find out the results. Do not assume everything is normal if you have not heard from your caregiver or the medical facility. It is important for you to follow up on all of your test results. HOME CARE INSTRUCTIONS   It is not unusual to pass moderate amounts of gas and experience mild abdominal cramping following the procedure. This is due to air being used to inflate your colon during the exam. Walking or a warm pack on your belly (abdomen) may help.   You may resume all normal meals and activities after sedatives and medicines have worn off.   Only take over-the-counter or prescription medicines for pain, discomfort, or fever as directed by your caregiver. Do not use aspirin or blood thinners if a biopsy was taken. Consult your caregiver for medicine usage if biopsies were taken.  SEEK IMMEDIATE MEDICAL CARE IF:   You have a fever.   You pass large blood clots or fill a toilet with blood following the procedure. This may also occur 10 to 14 days following the procedure. This is more likely if a biopsy  was taken.   You develop abdominal pain that keeps getting worse and cannot be relieved with medicine.  Document Released: 03/08/2000 Document Revised: 02/28/2011 Document Reviewed: 10/22/2007 [facility] Patient Information 2012 [place], [place].

## 2011-10-09 NOTE — Progress Notes (Signed)
Jasmine Stephens [id number] 11/29/1950   HISTORY OR PRESENT ILLNESS : Patient is a 61 year old female known to Dr. Juanda Chance for history of irritable bowel syndrome and diverticulosis. She gives a history of diverticulitis. I do believe that diverticulitis has ever been documented by CT scan but we treated her empirically in February of this year. Patient uses Robinul as needed for abdominal pain and bloating.. Robinul usually helps after a couple of days but patient has had progressive symptoms over the last couple of weeks. She complains of bloating and diffuse lower abdominal discomfort.  She is also having nausea. She has chronic alternating bowel habits but lately she has tended towards constipation  Current Medications, Allergies, Past Medical History, Past Surgical History, Family History and Social History were reviewed in [facility] electronic medical record.   PHYSICAL EXAMINATION : General:  Well developed  female in no acute distress Head: Normocephalic and atraumatic Eyes:  sclerae anicteric,conjunctive pink. Ears: Normal auditory acuity Neck: Supple, no masses.  Lungs: Clear throughout to auscultation Heart: Regular rate and rhythm; no murmurs heard Abdomen: Soft, non distended, mild lower abdominal tenderness.  No masses or hepatomegaly noted. Normal bowel sounds Rectal: not done Musculoskeletal: Symmetrical with no gross deformities  Skin: No lesions on visible extremities Extremities: No edema or deformities noted Neurological: Oriented x 4, grossly non focal Cervical Nodes:  No significant cervical adenopathy Psychological:  Alert and cooperative. Normal mood and affect  ASSESSMENT AND PLAN :  1. Irritable bowel syndrome flare. Patient normally has alternating constipation and diarrhea but more constipated with bloating and gas lately.  Begin MiraLax twice daily to get  bowels going. We gave her 2 weeks worth of Align samples for the bloating and gas. If pain persists  beyond these measures she may may need a CT scan to rule out diverticulitis. Advised patient not to use Robinul Forte more than once a day as it can cause worsening constipation  .2.Colon cancer screening. Patient has a family history of colon cancer in a grandmother and aunt. Her last colonoscopy was 3 years ago with findings of only mild diverticulosis. A 10 year surveillance colonoscopy was recommended but patient thought she was due for surveillance exam now. Patient will be scheduled for colonoscopy pending my discussion with Dr. Juanda Chance

## 2011-10-10 ENCOUNTER — Encounter: Payer: Self-pay | Admitting: Nurse Practitioner

## 2011-10-10 ENCOUNTER — Telehealth: Payer: Self-pay | Admitting: *Deleted

## 2011-10-10 ENCOUNTER — Encounter: Payer: Self-pay | Admitting: Internal Medicine

## 2011-10-10 NOTE — Progress Notes (Signed)
Reviewed and agree with management plan. Chrishun Scheer T. Geanna Divirgilio MD FACG 

## 2011-10-10 NOTE — Telephone Encounter (Signed)
I also  cancelled the colonoscopy we scheduled for 12-06-2011.  Pt informed.  I also called [facility] to advise we do not need the Moviprep prescription.

## 2011-10-10 NOTE — Telephone Encounter (Signed)
Gunnar Fusi called me to inform me she spoke to Dr. Juanda Chance about this patients next colon due date.  Her last colon was 08-2008.  Dr Juanda Chance told Gunnar Fusi since it was not a 1st degree relative that had the colon cancer in her family, she go to 2015 for her next colonoscopy.  I called the pt to inform her and she was fine with that.  I advised her we will send her a reminder letter in May of 2015.  I changed the recall date and the inform patient date.

## 2011-12-01 ENCOUNTER — Other Ambulatory Visit: Payer: Self-pay | Admitting: Internal Medicine

## 2011-12-06 ENCOUNTER — Encounter: Payer: 59 | Admitting: Internal Medicine

## 2012-05-31 ENCOUNTER — Other Ambulatory Visit: Payer: Self-pay | Admitting: Internal Medicine

## 2012-06-08 ENCOUNTER — Telehealth: Payer: Self-pay | Admitting: Internal Medicine

## 2012-06-08 MED ORDER — HYOSCYAMINE SULFATE 0.125 MG SL SUBL: SUBLINGUAL_TABLET | SUBLINGUAL | Status: DC

## 2012-06-08 NOTE — Telephone Encounter (Signed)
It sounds like her IBS is flaring up. Please substitute LevsinSL.125 mg, #30, for Robinul ( which is stronger) and take it regularly  Bid SL for next 10-14 days. Bland light diet. Cont Probiotic 1 po qd. OV

## 2012-06-08 NOTE — Telephone Encounter (Signed)
Patient given Dr. Regino Schultze recommendations. Rx sent to pharmacy. Offered OV with extender this week but she would like to see Dr. Juanda Chance. Scheduled on 06/24/12 at 2:00 PM. Patient understands to call back if not better and schedule with extender.

## 2012-06-08 NOTE — Telephone Encounter (Signed)
Spoke with patient and she states on Saturday, she started having abdominal cramping. The pain is more on the left side at the belly button area. She took Robinul last night and it helped her sleep but has returned today. She does not like to take Robinul during the day because is makes her sleepy. She has been under a lot of stress at work. She is taking a probiotic and Nexium daily. She was out of her Nexium for several days and thinks this started her problem. She is taking Lorazepam also. She reports she is having diarrhea alternating with constipation. Reports her last BM was last Thursday and it was loose. Hx IBS last colonoscopy 08/23/08- benign polyp.  Please, advise.

## 2012-06-24 ENCOUNTER — Ambulatory Visit: Payer: 59 | Admitting: Internal Medicine

## 2012-08-10 ENCOUNTER — Ambulatory Visit: Payer: 59 | Admitting: Internal Medicine

## 2012-08-10 ENCOUNTER — Telehealth: Payer: Self-pay | Admitting: Internal Medicine

## 2012-08-10 NOTE — Telephone Encounter (Signed)
Patient states she has been doing well since Dr. Juanda Chance called in [place] for her. She is asking if Dr. Juanda Chance thinks she really needs to come for OV today. Please, advise.

## 2012-08-10 NOTE — Telephone Encounter (Signed)
Patient notified and appointment cancelled.

## 2012-08-10 NOTE — Telephone Encounter (Signed)
No need to com e, please put Mrs Tamala Fothergill in her place.

## 2012-11-20 ENCOUNTER — Other Ambulatory Visit: Payer: Self-pay | Admitting: Internal Medicine

## 2013-01-18 ENCOUNTER — Emergency Department (HOSPITAL_COMMUNITY): Payer: BC Managed Care – PPO

## 2013-01-18 ENCOUNTER — Encounter (HOSPITAL_COMMUNITY): Payer: Self-pay | Admitting: Emergency Medicine

## 2013-01-18 ENCOUNTER — Emergency Department (HOSPITAL_COMMUNITY)
Admission: EM | Admit: 2013-01-18 | Discharge: 2013-01-18 | Disposition: A | Discharge status: Home / Self Care | Payer: BC Managed Care – PPO | Attending: Emergency Medicine | Admitting: Emergency Medicine

## 2013-01-18 DIAGNOSIS — Z860100 Personal history of colon polyps, unspecified: Secondary | ICD-10-CM | POA: Insufficient documentation

## 2013-01-18 DIAGNOSIS — IMO0001 Reserved for inherently not codable concepts without codable children: Secondary | ICD-10-CM | POA: Insufficient documentation

## 2013-01-18 DIAGNOSIS — Z888 Allergy status to other drugs, medicaments and biological substances status: Secondary | ICD-10-CM | POA: Insufficient documentation

## 2013-01-18 DIAGNOSIS — R059 Cough, unspecified: Secondary | ICD-10-CM | POA: Insufficient documentation

## 2013-01-18 DIAGNOSIS — J309 Allergic rhinitis, unspecified: Secondary | ICD-10-CM | POA: Insufficient documentation

## 2013-01-18 DIAGNOSIS — M542 Cervicalgia: Secondary | ICD-10-CM | POA: Insufficient documentation

## 2013-01-18 DIAGNOSIS — K589 Irritable bowel syndrome without diarrhea: Secondary | ICD-10-CM | POA: Insufficient documentation

## 2013-01-18 DIAGNOSIS — J3489 Other specified disorders of nose and nasal sinuses: Secondary | ICD-10-CM | POA: Insufficient documentation

## 2013-01-18 DIAGNOSIS — M62838 Other muscle spasm: Secondary | ICD-10-CM | POA: Insufficient documentation

## 2013-01-18 DIAGNOSIS — K219 Gastro-esophageal reflux disease without esophagitis: Secondary | ICD-10-CM | POA: Insufficient documentation

## 2013-01-18 DIAGNOSIS — Z880 Allergy status to penicillin: Secondary | ICD-10-CM | POA: Insufficient documentation

## 2013-01-18 DIAGNOSIS — R51 Headache: Secondary | ICD-10-CM | POA: Insufficient documentation

## 2013-01-18 DIAGNOSIS — R05 Cough: Secondary | ICD-10-CM | POA: Insufficient documentation

## 2013-01-18 DIAGNOSIS — H9209 Otalgia, unspecified ear: Secondary | ICD-10-CM | POA: Insufficient documentation

## 2013-01-18 DIAGNOSIS — F341 Dysthymic disorder: Secondary | ICD-10-CM | POA: Insufficient documentation

## 2013-01-18 DIAGNOSIS — M79602 Pain in left arm: Secondary | ICD-10-CM

## 2013-01-18 DIAGNOSIS — M79609 Pain in unspecified limb: Secondary | ICD-10-CM | POA: Insufficient documentation

## 2013-01-18 DIAGNOSIS — Z8719 Personal history of other diseases of the digestive system: Secondary | ICD-10-CM | POA: Insufficient documentation

## 2013-01-18 DIAGNOSIS — Z79899 Other long term (current) drug therapy: Secondary | ICD-10-CM | POA: Insufficient documentation

## 2013-01-18 LAB — CBC
HCT: 42.5 % (ref 36.0–46.0)
Hemoglobin: 14.3 g/dL (ref 12.0–15.0)
MCHC: 33.6 g/dL (ref 30.0–36.0)

## 2013-01-18 LAB — POCT I-STAT TROPONIN I: Troponin i, poc: 0 ng/mL (ref 0.00–0.08)

## 2013-01-18 LAB — BASIC METABOLIC PANEL
BUN: 15 mg/dL (ref 6–23)
Chloride: 102 mEq/L (ref 96–112)
Glucose, Bld: 112 mg/dL — ABNORMAL HIGH (ref 70–99)
Potassium: 4.4 mEq/L (ref 3.5–5.1)

## 2013-01-18 MED ORDER — DIAZEPAM 5 MG PO TABS: 5.0000 mg | ORAL_TABLET | Freq: Two times a day (BID) | ORAL | Status: DC

## 2013-01-18 MED ORDER — MORPHINE SULFATE 4 MG/ML IJ SOLN
4.0000 mg | Freq: Once | INTRAMUSCULAR | Status: DC
  Filled 2013-01-18: qty 1

## 2013-01-18 MED ORDER — MORPHINE SULFATE 4 MG/ML IJ SOLN
4.0000 mg | Freq: Once | INTRAMUSCULAR | Status: AC
  Administered 2013-01-18: 4 mg via INTRAMUSCULAR

## 2013-01-18 NOTE — ED Notes (Signed)
Patient transported to X-ray 

## 2013-01-18 NOTE — ED Notes (Signed)
Pt is here with left arm pain, neck pain, and dizziness.  Pt reports that she has recently been treating self for sinus infection.  Pt denies chest pain

## 2013-01-18 NOTE — ED Provider Notes (Signed)
Pt signed out to me by Francee Piccolo, PA-C at shift change. Plan is to wait for repeat troponin. If negative, pt may be discharged home with muscle relaxer and PCP follow up.   Troponin: negative  All labs/imaging/findings discussed with patient. All questions answered and concerns addressed. Will discharge pt home and have pt f/u with her PCP, Dr. Corliss Blacker. Return precautions given. Pt verbalized understanding and agreement with tx plan. Vitals: unremarkable. Discharged in stable condition.    Discussed pt with attending during ED encounter and agrees with plan.   Junius Finner, PA-C 01/18/13 1745

## 2013-01-18 NOTE — ED Notes (Signed)
Report received, assumed care.  

## 2013-01-18 NOTE — ED Provider Notes (Signed)
CSN: [id number]     Arrival date & time 01/18/13  1216 History   First MD Initiated Contact with Patient 01/18/13 1412     Chief Complaint  Patient presents with  . Dizziness  . Arm Pain   (Consider location/radiation/quality/duration/timing/severity/associated sxs/prior Treatment) HPI Comments: Patient is a 62 yo F PMHx significant for IBS, GERD, Diverticulosis, Anxiety, and Depression presenting to the emergency department for left-sided neck and arm pain. Patient describes it as moderate sore and sharp in nature that began this morning after she woke up. Patient states since Friday she has been dealing with lightheadedness when sitting up in bed, nasal congestion, rhinorrhea, generalized headache and, dry nonproductive cough. Patient states she has taken one dose of these meds without relief, has not tried any over-the-counter medications or at home symptomatic care for any of her symptoms. Patient denies being lightheaded at any other point in the day, she denies any syncope. Patient denies any chest pain, shortness of breath, vomiting, abdominal pain, diaphoresis. Patient has no known cardiac history. Patient does have familial cardiac history.   Past Medical History  Diagnosis Date  . Irritable bowel syndrome   . GERD (gastroesophageal reflux disease)   . Diverticulosis   . Colon polyp   . Anxiety and depression    Past Surgical History  Procedure Laterality Date  . Cholecystectomy    . Colonoscopy    . Upper gastrointestinal endoscopy    . Partial hysterectomy    . Back surgery    . Knee arthroscopy      left   Family History  Problem Relation Age of Onset  . Heart disease Mother   . Heart disease Father   . Colon cancer Paternal Grandmother   . Heart disease Brother    History  Substance Use Topics  . Smoking status: Never Smoker   . Smokeless tobacco: Never Used  . Alcohol Use: Yes   OB History   Grav Para Term Preterm Abortions TAB SAB Ect Mult Living         Review of Systems  Constitutional: Negative for fever and chills.  HENT: Positive for congestion, ear pain and rhinorrhea. Negative for trouble swallowing.   Eyes: Negative.   Respiratory: Positive for cough. Negative for chest tightness, shortness of breath and wheezing.   Cardiovascular: Negative for chest pain, palpitations and leg swelling.  Gastrointestinal: Negative for nausea, vomiting and abdominal pain.  Genitourinary: Negative.   Musculoskeletal: Positive for arthralgias and myalgias.  Skin: Negative.   Neurological: Positive for light-headedness.    Allergies  Chlorpromazine hcl and Penicillins  Home Medications   Current Outpatient Rx  Name  Route  Sig  Dispense  Refill  . Cholecalciferol (VITAMIN D) 2000 UNITS CAPS   Oral   Take 1 capsule by mouth daily.         Marland Kitchen esomeprazole (NEXIUM) 40 MG capsule   Oral   Take 40 mg by mouth daily before breakfast.         . glycopyrrolate (ROBINUL) 2 MG tablet   Oral   Take 2 mg by mouth daily as needed (for stomach cramping).         . hyoscyamine (LEVSIN, ANASPAZ) 0.125 MG tablet   Oral   Take 0.125 mg by mouth 2 (two) times daily as needed for cramping.         Marland Kitchen LORazepam (ATIVAN) 0.5 MG tablet   Oral   Take 0.5 mg by mouth every 8 (eight) hours as needed  for anxiety.         . pseudoephedrine-guaifenesin (MUCINEX D) 60-600 MG per tablet   Oral   Take 1 tablet by mouth 2 (two) times daily as needed for congestion.          BP 129/55  Pulse 52  Temp(Src) 98.5 F (36.9 C) (Oral)  Resp 15  Wt 218 lb 3 oz (98.969 kg)  BMI 35.23 kg/m2  SpO2 98% Physical Exam  Constitutional: She is oriented to person, place, and time. She appears well-developed and well-nourished. No distress.  HENT:  Head: Normocephalic and atraumatic.  Right Ear: Hearing, tympanic membrane, external ear and ear canal normal. No tenderness. No foreign bodies. No middle ear effusion. No decreased hearing is noted.  Left  Ear: Hearing, tympanic membrane, external ear and ear canal normal. No tenderness. No foreign bodies.  No middle ear effusion. No decreased hearing is noted.  Nose: Rhinorrhea present. Right sinus exhibits maxillary sinus tenderness. Right sinus exhibits no frontal sinus tenderness. Left sinus exhibits maxillary sinus tenderness. Left sinus exhibits no frontal sinus tenderness.  Mouth/Throat: Uvula is midline, oropharynx is clear and moist and mucous membranes are normal.  Eyes: Conjunctivae and EOM are normal. Pupils are equal, round, and reactive to light.  Neck: Normal range of motion. Neck supple. Muscular tenderness present. No spinous process tenderness present. No rigidity. No edema, no erythema and normal range of motion present. No Brudzinski's sign and no Kernig's sign noted.    Cardiovascular: Normal rate, regular rhythm, normal heart sounds and intact distal pulses.   Pulmonary/Chest: Effort normal and breath sounds normal. No respiratory distress. She exhibits no tenderness.  Abdominal: Soft. Bowel sounds are normal. There is no tenderness.  Musculoskeletal: Normal range of motion. She exhibits no edema.  Neurological: She is alert and oriented to person, place, and time.  Skin: Skin is warm and dry. She is not diaphoretic.  Psychiatric: She has a normal mood and affect.    ED Course  Procedures (including critical care time) Labs Review Labs Reviewed  BASIC METABOLIC PANEL - Abnormal; Notable for the following:    Glucose, Bld 112 (*)    GFR calc non Af Amer 65 (*)    GFR calc Af Amer 75 (*)    All other components within normal limits  CBC  POCT I-STAT TROPONIN I   Imaging Review Dg Chest 2 View  01/18/2013   CLINICAL DATA:  Dizziness. Left arm pain.  EXAM: CHEST  2 VIEW  COMPARISON:  CHEST x-ray 10/19/2010.  FINDINGS: Lung volumes are slightly low. No consolidative airspace disease. No pleural effusions. No evidence of pulmonary edema. Heart size is normal. No  suspicious appearing pulmonary nodules or masses are identified at this time. Upper mediastinal contours are within normal limits. Visualized bony thorax appears grossly intact.  IMPRESSION: 1. Low lung volumes without radiographic evidence of acute cardiopulmonary disease.   Electronically Signed   By: Trudie Reed M.D.   On: 01/18/2013 15:14    EKG Interpretation     Ventricular Rate:  60 PR Interval:  146 QRS Duration: 96 QT Interval:  436 QTC Calculation: 436 R Axis:   -2 Text Interpretation:  Normal sinus rhythm Low voltage QRS Borderline ECG No previous tracing            MDM  No diagnosis found.  Afebrile, NAD, non-toxic appearing, AAOx4. Neurovascularly intact. No sensory deficit. No neurofocal deficits. Muscle spasm appreciated on examination of left neck. Left sided neck pain and  arm pain likely not cardiac in origin. EKG, CXR, labs, and first troponin all negative. Will obtain a 3 hour troponin to ensure no trending upwards. Will likely d/c patient home with pain management and PCP f/u. Troponin pending at shift change. Care shifted to [facility], PA-C.      Lise Auer Lakedra Washington, PA-C 01/18/13 1640

## 2013-01-19 NOTE — ED Provider Notes (Signed)
Medical screening examination/treatment/procedure(s) were performed by non-physician practitioner and as supervising physician I was immediately available for consultation/collaboration.  EKG Interpretation     Ventricular Rate:  60 PR Interval:  146 QRS Duration: 96 QT Interval:  436 QTC Calculation: 436 R Axis:   -2 Text Interpretation:  Normal sinus rhythm Low voltage QRS Borderline ECG No previous tracing              Shelda Jakes, MD 01/19/13 [id number]

## 2013-01-23 NOTE — ED Provider Notes (Signed)
Medical screening examination/treatment/procedure(s) were performed by non-physician practitioner and as supervising physician I was immediately available for consultation/collaboration.  EKG Interpretation     Ventricular Rate:  60 PR Interval:  146 QRS Duration: 96 QT Interval:  436 QTC Calculation: 436 R Axis:   -2 Text Interpretation:  Normal sinus rhythm Low voltage QRS Borderline ECG No previous tracing              Roney Marion, MD 01/23/13 1051

## 2013-02-23 ENCOUNTER — Ambulatory Visit (INDEPENDENT_AMBULATORY_CARE_PROVIDER_SITE_OTHER): Payer: BC Managed Care – PPO | Admitting: General Practice

## 2013-02-23 ENCOUNTER — Telehealth: Payer: Self-pay | Admitting: Family Medicine

## 2013-02-23 ENCOUNTER — Encounter (INDEPENDENT_AMBULATORY_CARE_PROVIDER_SITE_OTHER): Payer: Self-pay

## 2013-02-23 ENCOUNTER — Encounter: Payer: Self-pay | Admitting: General Practice

## 2013-02-23 VITALS — BP 129/81 | HR 77 | Temp 98.2°F

## 2013-02-23 DIAGNOSIS — J329 Chronic sinusitis, unspecified: Secondary | ICD-10-CM

## 2013-02-23 DIAGNOSIS — J029 Acute pharyngitis, unspecified: Secondary | ICD-10-CM

## 2013-02-23 MED ORDER — AZITHROMYCIN 250 MG PO TABS: ORAL_TABLET | ORAL | Status: DC

## 2013-02-23 NOTE — Patient Instructions (Signed)

## 2013-02-23 NOTE — Telephone Encounter (Signed)
SORE THROAT AND CONGESTION APPT SCHEDULED PT NOTIFIED

## 2013-02-23 NOTE — Progress Notes (Signed)
   Subjective:    Patient ID: Jasmine Stephens, female    DOB: [birth year], 62 y.o.   MRN: [id number]  Sore Throat  This is a new problem. The current episode started in the past 7 days. The problem has been gradually worsening. Neither side of throat is experiencing more pain than the other. There has been no fever. The pain is at a severity of 7/10. Associated symptoms include congestion, coughing and headaches. Pertinent negatives include no abdominal pain or vomiting. She has had no exposure to strep or mono. She has tried NSAIDs for the symptoms. The treatment provided no relief.      Review of Systems  Constitutional: Positive for chills. Negative for fever.  HENT: Positive for congestion, postnasal drip, sinus pressure and sore throat.   Respiratory: Positive for cough. Negative for chest tightness and wheezing.   Cardiovascular: Negative for chest pain and palpitations.  Gastrointestinal: Negative for vomiting and abdominal pain.  Neurological: Positive for headaches. Negative for dizziness and weakness.       Objective:   Physical Exam  Constitutional: She is oriented to person, place, and time. She appears well-developed and well-nourished.  HENT:  Head: Normocephalic and atraumatic.  Right Ear: External ear normal.  Left Ear: External ear normal.  Nose: Right sinus exhibits maxillary sinus tenderness and frontal sinus tenderness. Left sinus exhibits maxillary sinus tenderness and frontal sinus tenderness.  Mouth/Throat: Posterior oropharyngeal erythema present.  Cardiovascular: Normal rate, regular rhythm and normal heart sounds.   Pulmonary/Chest: Effort normal and breath sounds normal. No respiratory distress. She exhibits no tenderness.  Neurological: She is alert and oriented to person, place, and time.  Skin: Skin is warm and dry.  Psychiatric: She has a normal mood and affect.          Assessment & Plan:  1. Sore throat and sinusitis - POCT rapid strep A -may  gargle with warm salt water Increase fluid intake Motrin or tylenol OTC OTC decongestant Throat lozenges if help New toothbrush in 3 days Proper handwashing - azithromycin (ZITHROMAX) 250 MG tablet; Take as directed  Dispense: 6 tablet; Refill: 0 RTO if symptoms worsen or unresolved Patient verbalized understanding Coralie Keens, FNP-C

## 2013-02-26 ENCOUNTER — Encounter: Payer: Self-pay | Admitting: General Practice

## 2013-02-26 ENCOUNTER — Ambulatory Visit (INDEPENDENT_AMBULATORY_CARE_PROVIDER_SITE_OTHER): Payer: BC Managed Care – PPO | Admitting: General Practice

## 2013-02-26 VITALS — BP 126/79 | HR 80 | Temp 97.7°F

## 2013-02-26 DIAGNOSIS — J329 Chronic sinusitis, unspecified: Secondary | ICD-10-CM

## 2013-02-26 MED ORDER — METHYLPREDNISOLONE ACETATE 80 MG/ML IJ SUSP
80.0000 mg | Freq: Once | INTRAMUSCULAR | Status: AC
  Administered 2013-02-26: 80 mg via INTRAMUSCULAR

## 2013-02-26 MED ORDER — PREDNISONE (PAK) 10 MG PO TABS: ORAL_TABLET | ORAL | Status: DC

## 2013-02-26 NOTE — Progress Notes (Signed)
Depomedrol 80mg  im given in Right gluteal area. Pt tolerated will

## 2013-02-26 NOTE — Patient Instructions (Signed)

## 2013-02-26 NOTE — Progress Notes (Signed)
   Subjective:    Patient ID: Jasmine Stephens, female    DOB: [birth year], 62 y.o.   MRN: [id number]  HPI Patient presents today for follow up of sinusitis. She reports some improvement in sinus pressure, but still present. Reports taking antibiotics as prescribed. Denies sore throat.     Review of Systems  Constitutional: Negative for fever.  HENT: Positive for postnasal drip.   Respiratory: Negative for chest tightness.   Cardiovascular: Negative for chest pain and palpitations.  Neurological: Negative for dizziness and weakness.       Objective:   Physical Exam  Constitutional: She is oriented to person, place, and time. She appears well-developed and well-nourished.  HENT:  Head: Normocephalic and atraumatic.  Right Ear: External ear normal.  Left Ear: External ear normal.  Nose: Right sinus exhibits maxillary sinus tenderness and frontal sinus tenderness. Left sinus exhibits maxillary sinus tenderness and frontal sinus tenderness.  Cardiovascular: Normal rate, regular rhythm and normal heart sounds.   Pulmonary/Chest: Effort normal and breath sounds normal. No respiratory distress. She exhibits no tenderness.  Neurological: She is alert and oriented to person, place, and time.  Skin: Skin is warm and dry.  Psychiatric: She has a normal mood and affect.          Assessment & Plan:  1. Sinusitis - methylPREDNISolone acetate (DEPO-MEDROL) injection 80 mg; Inject 1 mL (80 mg total) into the muscle once. - predniSONE (STERAPRED UNI-PAK) 10 MG tablet; Start tomorrow 02/27/13  Dispense: 21 tablet; Refill: 0 -adequate fluids -simply saline nasal spray -RTO if symptoms worsen or unresolved, will refer to ENT -Patient verbalized understanding Coralie Keens, FNP-C

## 2013-03-29 ENCOUNTER — Other Ambulatory Visit: Payer: Self-pay | Admitting: Internal Medicine

## 2013-04-28 ENCOUNTER — Encounter: Payer: Self-pay | Admitting: General Practice

## 2013-04-28 ENCOUNTER — Ambulatory Visit (INDEPENDENT_AMBULATORY_CARE_PROVIDER_SITE_OTHER): Payer: BC Managed Care – PPO | Admitting: General Practice

## 2013-04-28 VITALS — BP 129/69 | HR 81 | Temp 97.9°F

## 2013-04-28 DIAGNOSIS — J0100 Acute maxillary sinusitis, unspecified: Secondary | ICD-10-CM

## 2013-04-28 MED ORDER — DOXYCYCLINE HYCLATE 100 MG PO TABS: 100.0000 mg | ORAL_TABLET | Freq: Two times a day (BID) | ORAL | Status: DC

## 2013-04-28 MED ORDER — METHYLPREDNISOLONE ACETATE 80 MG/ML IJ SUSP
80.0000 mg | Freq: Once | INTRAMUSCULAR | Status: AC
  Administered 2013-04-28: 80 mg via INTRAMUSCULAR

## 2013-04-28 MED ORDER — PREDNISONE (PAK) 10 MG PO TABS: ORAL_TABLET | ORAL | Status: DC

## 2013-04-28 NOTE — Patient Instructions (Signed)

## 2013-04-29 NOTE — Progress Notes (Signed)
   Subjective:    Patient ID: Jasmine Stephens, female    DOB: Jan 01, 1951, 63 y.o.   MRN: [id number]  Sinusitis This is a new problem. The current episode started in the past 7 days. The problem has been gradually worsening since onset. There has been no fever. Associated symptoms include congestion and sinus pressure. Pertinent negatives include no chills, shortness of breath or sore throat. Past treatments include oral decongestants. The treatment provided mild relief.      Review of Systems  Constitutional: Negative for fever and chills.  HENT: Positive for congestion and sinus pressure. Negative for sore throat.   Respiratory: Negative for chest tightness and shortness of breath.   Cardiovascular: Negative for chest pain and palpitations.       Objective:   Physical Exam  Constitutional: She appears well-developed and well-nourished.  HENT:  Head: Normocephalic and atraumatic.  Nose: Right sinus exhibits maxillary sinus tenderness and frontal sinus tenderness. Left sinus exhibits maxillary sinus tenderness and frontal sinus tenderness.  Mouth/Throat: Posterior oropharyngeal erythema present.  Cardiovascular: Normal rate, regular rhythm and normal heart sounds.   No murmur heard. Pulmonary/Chest: Effort normal and breath sounds normal. No respiratory distress. She exhibits no tenderness.  Neurological: She is alert.  Skin: Skin is warm and dry. No rash noted.  Psychiatric: She has a normal mood and affect.          Assessment & Plan:  1. Sinusitis, acute maxillary - methylPREDNISolone acetate (DEPO-MEDROL) injection 80 mg; Inject 1 mL (80 mg total) into the muscle once. - predniSONE (STERAPRED UNI-PAK) 10 MG tablet; Take as directed. Start on 04/29/13  Dispense: 21 tablet; Refill: 0 - doxycycline (VIBRA-TABS) 100 MG tablet; Take 1 tablet (100 mg total) by mouth 2 (two) times daily.  Dispense: 14 tablet; Refill: 0 -increase fluids -RTO if symptom worsen or unresolved -Patient  verbalized understanding Erby Pian, FNP-C

## 2013-05-05 ENCOUNTER — Telehealth: Payer: Self-pay | Admitting: Internal Medicine

## 2013-05-05 NOTE — Telephone Encounter (Signed)
Left a message for patient to call me. 

## 2013-05-06 NOTE — Telephone Encounter (Signed)
Left a message for patient to call me. 

## 2013-05-06 NOTE — Telephone Encounter (Signed)
Spoke with patient and she is having abdominal pressure and discomfort at the end of bra. Hyoscyamine not helping. She will increase Nexium 40 mg to BID until OV. Scheduled with Dr. Olevia Perches at 3:00 PM today.

## 2013-05-07 ENCOUNTER — Encounter: Payer: Self-pay | Admitting: Internal Medicine

## 2013-05-07 ENCOUNTER — Ambulatory Visit (INDEPENDENT_AMBULATORY_CARE_PROVIDER_SITE_OTHER): Payer: BC Managed Care – PPO | Admitting: Internal Medicine

## 2013-05-07 VITALS — BP 112/68 | HR 76 | Ht 65.5 in | Wt 211.5 lb

## 2013-05-07 DIAGNOSIS — K2960 Other gastritis without bleeding: Secondary | ICD-10-CM

## 2013-05-07 DIAGNOSIS — R1013 Epigastric pain: Secondary | ICD-10-CM

## 2013-05-07 MED ORDER — ONDANSETRON HCL 4 MG PO TABS: 4.0000 mg | ORAL_TABLET | Freq: Three times a day (TID) | ORAL | Status: DC | PRN

## 2013-05-07 MED ORDER — SUCRALFATE 1 GM/10ML PO SUSP: 1.0000 g | Freq: Two times a day (BID) | ORAL | Status: DC

## 2013-05-07 MED ORDER — FLUCONAZOLE 100 MG PO TABS: 100.0000 mg | ORAL_TABLET | Freq: Every day | ORAL | Status: DC

## 2013-05-07 MED ORDER — GLYCOPYRROLATE 2 MG PO TABS: 2.0000 mg | ORAL_TABLET | Freq: Every day | ORAL | Status: DC | PRN

## 2013-05-07 NOTE — Progress Notes (Signed)
Jasmine Stephens 07/11/1950 [id number]  Note: This dictation was prepared with Dragon digital system. Any transcriptional errors that result from this procedure are unintentional.   History of Present Illness: This is a 63-year-old white female with irritable bowel syndrome with predominant diarrhea. She comes today with antibiotic induced gastritis. She was treated for sinusitis with steroid Dosepak and with the broad-spectrum antibiotics. She developed burning in the epigastrium nausea and pain around the umbilicus. She denies rectal bleeding or weight loss. She will be due for colonoscopy in [place] of this year. Last exam in [place] 2010 showed colon polyps. There is a family history of colon cancer in maternal grandmother and aunt.    Past Medical History  Diagnosis Date  . Irritable bowel syndrome   . GERD (gastroesophageal reflux disease)   . Diverticulosis   . Colon polyp   . Anxiety and depression     Past Surgical History  Procedure Laterality Date  . Cholecystectomy    . Colonoscopy    . Upper gastrointestinal endoscopy    . Partial hysterectomy    . Back surgery    . Knee arthroscopy      left    Allergies  Allergen Reactions  . Chlorpromazine Hcl Other (See Comments)    Reaction unknown  . Compazine [Prochlorperazine]   . Penicillins Other (See Comments)    Reaction unkwown    Family history and social history have been reviewed.  Review of Systems: Denies dysphagia but has had vaginal yeast infection  The remainder of the 10 point ROS is negative except as outlined in the H&P  Physical Exam: General Appearance Well developed, in no distress Eyes  Non icteric  HEENT  Non traumatic, normocephalic  Mouth No lesion, tongue papillated, no cheilosis Neck Supple without adenopathy, thyroid not enlarged, no carotid bruits, no JVD Lungs Clear to auscultation bilaterally COR Normal S1, normal S2, regular rhythm, no murmur, quiet precordium Abdomen diffusely tender abdomen  through the epigastrium and all quadrants. No rebound or distention. No tympany. Bowel sounds are normal active, pulse cholecystectomy scar Rectal not done Extremities  No pedal edema Skin No lesions Neurological Alert and oriented x 3 Psychological Normal mood and affect  Assessment and Plan:   Problem #1  Epigastric pain consistent with gastritis. R/o secondary Cadida  infection. We will increase Nexium to 40 mg twice a day for two weeks and then back to once daily. We will also add Carafate slurry 10 cc twice a day. She will stay on a liquid diet for 24 to 48 hours. We will also give Zofran 4 mg when necessary for nausea. She will start Diflucan 100 mg daily x 3 and probiotics. We will schedule her for an ultrasound of the abdomen because some of her pain is radiating to the right scapula.    Jasmine Stephens 05/07/2013      

## 2013-05-07 NOTE — Patient Instructions (Addendum)
You have been scheduled for an abdominal ultrasound at [facility] (1st floor of hospital) on 05/11/13 at 7:30 am. Please arrive 15 minutes prior to your appointment for registration. Make certain not to have anything to eat or drink 6 hours prior to your appointment. Should you need to reschedule your appointment, please contact radiology at [phone]. This test typically takes about 30 minutes to perform.  We have sent the following medications to your pharmacy for you to pick up at your convenience: Nexium twice daily x 2 weeks, then back to once daily Carafate 10 ml twice daily Glycopyrolate Diflucan Promethazine  Please purchase a probiotic over the counter and takes once daily.   CC: Redge Gainer, MD

## 2013-05-11 ENCOUNTER — Ambulatory Visit (HOSPITAL_COMMUNITY): Payer: BC Managed Care – PPO

## 2013-05-13 ENCOUNTER — Telehealth: Payer: Self-pay | Admitting: Gastroenterology

## 2013-05-13 NOTE — Telephone Encounter (Signed)
Reviewed, If her ultrasound does not show anything abnormal and she still feels bad, we will proceed with EGD/colonoscopy. ( last colon 2010)

## 2013-05-13 NOTE — Telephone Encounter (Signed)
Patient states she is not feeling any better since OV. Continues to have epigastric pain, gas and burping. States she has been taking Zofran at night and this helps her sleep. She took Robinul but has stopped it due to not having a bowel movement since OV. She completed her Diflucan and is taking her Carafate and Nexium as ordered. She is having her ultrasound tomorrow.(it was r/s due to weather)

## 2013-05-14 ENCOUNTER — Ambulatory Visit (HOSPITAL_COMMUNITY)
Admission: RE | Admit: 2013-05-14 | Discharge: 2013-05-14 | Disposition: A | Discharge status: Home / Self Care | Payer: BC Managed Care – PPO | Source: Ambulatory Visit | Attending: Internal Medicine | Admitting: Internal Medicine

## 2013-05-14 DIAGNOSIS — K219 Gastro-esophageal reflux disease without esophagitis: Secondary | ICD-10-CM | POA: Insufficient documentation

## 2013-05-14 DIAGNOSIS — R1013 Epigastric pain: Secondary | ICD-10-CM | POA: Insufficient documentation

## 2013-05-14 DIAGNOSIS — Z9089 Acquired absence of other organs: Secondary | ICD-10-CM | POA: Insufficient documentation

## 2013-05-14 DIAGNOSIS — K5730 Diverticulosis of large intestine without perforation or abscess without bleeding: Secondary | ICD-10-CM | POA: Insufficient documentation

## 2013-05-14 DIAGNOSIS — K589 Irritable bowel syndrome without diarrhea: Secondary | ICD-10-CM | POA: Insufficient documentation

## 2013-05-17 ENCOUNTER — Telehealth: Payer: Self-pay | Admitting: *Deleted

## 2013-05-17 DIAGNOSIS — Z1211 Encounter for screening for malignant neoplasm of colon: Secondary | ICD-10-CM

## 2013-05-17 DIAGNOSIS — R1013 Epigastric pain: Secondary | ICD-10-CM

## 2013-05-17 NOTE — Telephone Encounter (Signed)
Message copied by Hulan Saas on Mon May 17, 2013  3:51 PM ------      Message from: Lafayette Dragon      Created: Mon May 17, 2013  2:04 PM       Sure, go ahead. thanx Conscious sedation.      ----- Message -----         From: Hulan Saas, RN         Sent: 05/17/2013   9:47 AM           To: Lafayette Dragon, MD            Dr. Olevia Perches,      I cannot find enough time at [facility] for a double procedure until late April. Can I schedule her at the hospital on your hospital week?      Syon Tews       ------

## 2013-05-17 NOTE — Telephone Encounter (Signed)
Left a message for Sharee Pimple to call me.

## 2013-05-19 ENCOUNTER — Other Ambulatory Visit: Payer: Self-pay | Admitting: *Deleted

## 2013-05-19 DIAGNOSIS — Z1211 Encounter for screening for malignant neoplasm of colon: Secondary | ICD-10-CM

## 2013-05-19 DIAGNOSIS — R1013 Epigastric pain: Secondary | ICD-10-CM

## 2013-05-19 NOTE — Telephone Encounter (Signed)
Left a message for patient to call me. 

## 2013-05-19 NOTE — Telephone Encounter (Signed)
Spoke with Sharee Pimple and scheduled patient  On 05/31/13 at 10:45 AM. Left a message for patient to call me.

## 2013-05-21 ENCOUNTER — Encounter: Payer: Self-pay | Admitting: Internal Medicine

## 2013-05-21 NOTE — Telephone Encounter (Signed)
Spoke with patient and gave her appointment date and time. Scheduled pre visit on 05/24/13 at 4:00 PM.

## 2013-05-26 ENCOUNTER — Telehealth: Payer: Self-pay

## 2013-05-26 ENCOUNTER — Ambulatory Visit (AMBULATORY_SURGERY_CENTER): Payer: Self-pay

## 2013-05-26 VITALS — Ht 65.5 in | Wt 210.0 lb

## 2013-05-26 DIAGNOSIS — Z860100 Personal history of colon polyps, unspecified: Secondary | ICD-10-CM

## 2013-05-26 MED ORDER — MOVIPREP 100 G PO SOLR: 1.0000 | Freq: Once | ORAL | Status: DC

## 2013-05-27 NOTE — Telephone Encounter (Signed)
Entered in error

## 2013-05-31 ENCOUNTER — Encounter (HOSPITAL_COMMUNITY)
Admission: RE | Disposition: A | Discharge status: Home / Self Care | Payer: Self-pay | Source: Ambulatory Visit | Attending: Internal Medicine

## 2013-05-31 ENCOUNTER — Encounter (HOSPITAL_COMMUNITY): Payer: Self-pay | Admitting: *Deleted

## 2013-05-31 ENCOUNTER — Ambulatory Visit (HOSPITAL_COMMUNITY)
Admission: RE | Admit: 2013-05-31 | Discharge: 2013-05-31 | Disposition: A | Discharge status: Home / Self Care | Payer: BC Managed Care – PPO | Source: Ambulatory Visit | Attending: Internal Medicine | Admitting: Internal Medicine

## 2013-05-31 DIAGNOSIS — K219 Gastro-esophageal reflux disease without esophagitis: Secondary | ICD-10-CM | POA: Insufficient documentation

## 2013-05-31 DIAGNOSIS — Z9089 Acquired absence of other organs: Secondary | ICD-10-CM | POA: Insufficient documentation

## 2013-05-31 DIAGNOSIS — K222 Esophageal obstruction: Secondary | ICD-10-CM

## 2013-05-31 DIAGNOSIS — Z800 Family history of malignant neoplasm of digestive organs: Secondary | ICD-10-CM

## 2013-05-31 DIAGNOSIS — Z86010 Personal history of colonic polyps: Secondary | ICD-10-CM

## 2013-05-31 DIAGNOSIS — R1310 Dysphagia, unspecified: Secondary | ICD-10-CM | POA: Insufficient documentation

## 2013-05-31 DIAGNOSIS — K589 Irritable bowel syndrome without diarrhea: Secondary | ICD-10-CM | POA: Insufficient documentation

## 2013-05-31 DIAGNOSIS — R1013 Epigastric pain: Secondary | ICD-10-CM

## 2013-05-31 DIAGNOSIS — Z1211 Encounter for screening for malignant neoplasm of colon: Secondary | ICD-10-CM

## 2013-05-31 DIAGNOSIS — K5730 Diverticulosis of large intestine without perforation or abscess without bleeding: Secondary | ICD-10-CM | POA: Insufficient documentation

## 2013-05-31 DIAGNOSIS — R109 Unspecified abdominal pain: Secondary | ICD-10-CM | POA: Insufficient documentation

## 2013-05-31 HISTORY — DX: Anxiety disorder, unspecified: F41.9

## 2013-05-31 HISTORY — DX: Major depressive disorder, single episode, unspecified: F32.9

## 2013-05-31 HISTORY — PX: ESOPHAGOGASTRODUODENOSCOPY: SHX5428

## 2013-05-31 HISTORY — PX: COLONOSCOPY: SHX5424

## 2013-05-31 HISTORY — DX: Depression, unspecified: F32.A

## 2013-05-31 HISTORY — PX: MALONEY DILATION: SHX5535

## 2013-05-31 SURGERY — EGD (ESOPHAGOGASTRODUODENOSCOPY)
Anesthesia: Moderate Sedation

## 2013-05-31 SURGERY — COLONOSCOPY
Anesthesia: Moderate Sedation

## 2013-05-31 MED ORDER — BUTAMBEN-TETRACAINE-BENZOCAINE 2-2-14 % EX AERO
INHALATION_SPRAY | CUTANEOUS | Status: DC | PRN
  Administered 2013-05-31: 2 via TOPICAL

## 2013-05-31 MED ORDER — MIDAZOLAM HCL 10 MG/2ML IJ SOLN
INTRAMUSCULAR | Status: AC
  Filled 2013-05-31: qty 4

## 2013-05-31 MED ORDER — FENTANYL CITRATE 0.05 MG/ML IJ SOLN
INTRAMUSCULAR | Status: AC
  Filled 2013-05-31: qty 4

## 2013-05-31 MED ORDER — DIPHENHYDRAMINE HCL 50 MG/ML IJ SOLN
INTRAMUSCULAR | Status: AC
  Filled 2013-05-31: qty 1

## 2013-05-31 MED ORDER — HYOSCYAMINE SULFATE 0.125 MG SL SUBL: 0.1250 mg | SUBLINGUAL_TABLET | SUBLINGUAL | Status: DC | PRN

## 2013-05-31 MED ORDER — MIDAZOLAM HCL 10 MG/2ML IJ SOLN
INTRAMUSCULAR | Status: DC | PRN
  Administered 2013-05-31: 2 mg via INTRAVENOUS
  Administered 2013-05-31 (×2): 1 mg via INTRAVENOUS
  Administered 2013-05-31: 2 mg via INTRAVENOUS
  Administered 2013-05-31: 1 mg via INTRAVENOUS
  Administered 2013-05-31: 2 mg via INTRAVENOUS
  Administered 2013-05-31: 1 mg via INTRAVENOUS

## 2013-05-31 MED ORDER — SODIUM CHLORIDE 0.9 % IV SOLN
INTRAVENOUS | Status: DC
  Administered 2013-05-31: 10:00:00 via INTRAVENOUS

## 2013-05-31 MED ORDER — FENTANYL CITRATE 0.05 MG/ML IJ SOLN
INTRAMUSCULAR | Status: DC | PRN
  Administered 2013-05-31 (×4): 25 ug via INTRAVENOUS

## 2013-05-31 NOTE — Discharge Instructions (Signed)
Gastrointestinal Endoscopy Care After Refer to this sheet in the next few weeks. These instructions provide you with information on caring for yourself after your procedure. Your caregiver may also give you more specific instructions. Your treatment has been planned according to current medical practices, but problems sometimes occur. Call your caregiver if you have any problems or questions after your procedure. HOME CARE INSTRUCTIONS  If you were given medicine to help you relax (sedative), do not drive, operate machinery, or sign important documents for 24 hours.  Avoid alcohol and hot or warm beverages for the first 24 hours after the procedure.  Only take over-the-counter or prescription medicines for pain, discomfort, or fever as directed by your caregiver. You may resume taking your normal medicines unless your caregiver tells you otherwise. Ask your caregiver when you may resume taking medicines that may cause bleeding, such as aspirin, clopidogrel, or warfarin.  You may return to your normal diet and activities on the day after your procedure, or as directed by your caregiver. Walking may help to reduce any bloated feeling in your abdomen.  Drink enough fluids to keep your urine clear or pale yellow.  You may gargle with salt water if you have a sore throat. SEEK IMMEDIATE MEDICAL CARE IF:  You have severe nausea or vomiting.  You have severe abdominal pain, abdominal cramps that last longer than 6 hours, or abdominal swelling (distention).  You have severe shoulder or back pain.  You have trouble swallowing.  You have shortness of breath, your breathing is shallow, or you are breathing faster than normal.  You have a fever or a rapid heartbeat.  You vomit blood or material that looks like coffee grounds.  You have bloody, black, or tarry stools. MAKE SURE YOU:  Understand these instructions.  Will watch your condition.  Will get help right away if you are not doing  well or get worse. Document Released: 10/24/2003 Document Revised: 09/10/2011 Document Reviewed: 06/11/2011 [facility] Patient Information 2014 [place], [place]. Colonoscopy, Care After Refer to this sheet in the next few weeks. These instructions provide you with information on caring for yourself after your procedure. Your health care provider may also give you more specific instructions. Your treatment has been planned according to current medical practices, but problems sometimes occur. Call your health care provider if you have any problems or questions after your procedure. WHAT TO EXPECT AFTER THE PROCEDURE  After your procedure, it is typical to have the following:  A small amount of blood in your stool.  Moderate amounts of gas and mild abdominal cramping or bloating. HOME CARE INSTRUCTIONS  Do not drive, operate machinery, or sign important documents for 24 hours.  You may shower and resume your regular physical activities, but move at a slower pace for the first 24 hours.  Take frequent rest periods for the first 24 hours.  Walk around or put a warm pack on your abdomen to help reduce abdominal cramping and bloating.  Drink enough fluids to keep your urine clear or pale yellow.  You may resume your normal diet as instructed by your health care provider. Avoid heavy or fried foods that are hard to digest.  Avoid drinking alcohol for 24 hours or as instructed by your health care provider.  Only take over-the-counter or prescription medicines as directed by your health care provider.  If a tissue sample (biopsy) was taken during your procedure:  Do not take aspirin or blood thinners for 7 days, or as instructed by  your health care provider.  Do not drink alcohol for 7 days, or as instructed by your health care provider.  Eat soft foods for the first 24 hours. SEEK MEDICAL CARE IF: You have persistent spotting of blood in your stool 2 3 days after the procedure. SEEK  IMMEDIATE MEDICAL CARE IF:  You have more than a small spotting of blood in your stool.  You pass large blood clots in your stool.  Your abdomen is swollen (distended).  You have nausea or vomiting.  You have a fever.  You have increasing abdominal pain that is not relieved with medicine. Document Released: 10/24/2003 Document Revised: 12/30/2012 Document Reviewed: 11/16/2012 [facility] Patient Information 2014 [place].

## 2013-05-31 NOTE — H&P (View-Only) (Signed)
Jasmine Stephens 11/27/1950 [id number]  Note: This dictation was prepared with Dragon digital system. Any transcriptional errors that result from this procedure are unintentional.   History of Present Illness: This is a 63 year old white female with irritable bowel syndrome with predominant diarrhea. She comes today with antibiotic induced gastritis. She was treated for sinusitis with steroid Dosepak and with the broad-spectrum antibiotics. She developed burning in the epigastrium nausea and pain around the umbilicus. She denies rectal bleeding or weight loss. She will be due for colonoscopy in [place] of this year. Last exam in [place] 2010 showed colon polyps. There is a family history of colon cancer in maternal grandmother and aunt.    Past Medical History  Diagnosis Date  . Irritable bowel syndrome   . GERD (gastroesophageal reflux disease)   . Diverticulosis   . Colon polyp   . Anxiety and depression     Past Surgical History  Procedure Laterality Date  . Cholecystectomy    . Colonoscopy    . Upper gastrointestinal endoscopy    . Partial hysterectomy    . Back surgery    . Knee arthroscopy      left    Allergies  Allergen Reactions  . Chlorpromazine Hcl Other (See Comments)    Reaction unknown  . Compazine [Prochlorperazine]   . Penicillins Other (See Comments)    Reaction unkwown    Family history and social history have been reviewed.  Review of Systems: Denies dysphagia but has had vaginal yeast infection  The remainder of the 10 point ROS is negative except as outlined in the H&P  Physical Exam: General Appearance Well developed, in no distress Eyes  Non icteric  HEENT  Non traumatic, normocephalic  Mouth No lesion, tongue papillated, no cheilosis Neck Supple without adenopathy, thyroid not enlarged, no carotid bruits, no JVD Lungs Clear to auscultation bilaterally COR Normal S1, normal S2, regular rhythm, no murmur, quiet precordium Abdomen diffusely tender abdomen  through the epigastrium and all quadrants. No rebound or distention. No tympany. Bowel sounds are normal active, pulse cholecystectomy scar Rectal not done Extremities  No pedal edema Skin No lesions Neurological Alert and oriented x 3 Psychological Normal mood and affect  Assessment and Plan:   Problem #1  Epigastric pain consistent with gastritis. R/o secondary Cadida  infection. We will increase Nexium to 40 mg twice a day for two weeks and then back to once daily. We will also add Carafate slurry 10 cc twice a day. She will stay on a liquid diet for 24 to 48 hours. We will also give Zofran 4 mg when necessary for nausea. She will start Diflucan 100 mg daily x 3 and probiotics. We will schedule her for an ultrasound of the abdomen because some of her pain is radiating to the right scapula.    Delfin Edis 05/07/2013

## 2013-05-31 NOTE — Op Note (Signed)
[facility] [place], [address]   COLONOSCOPY PROCEDURE REPORT  PATIENT: Jasmine Stephens, Jasmine Stephens  MR#: [id number] BIRTHDATE: Jan 01, 1951 , 62  yrs. old GENDER: Female ENDOSCOPIST: Lafayette Dragon, MD REFERRED YT:[id number] Addison Lank, M.D. PROCEDURE DATE:  05/31/2013 PROCEDURE:   Colonoscopy, screening First Screening Colonoscopy - Avg.  risk and is 50 yrs.  old or older - No.  Prior Negative Screening - Now for repeat screening. N/A  History of Adenoma - Now for follow-up colonoscopy & has been > or = to 3 yrs.  Yes hx of adenoma.  Has been 3 or more years since last colonoscopy.  Polyps Removed Today? No.  Recommend repeat exam, <10 yrs? No. ASA CLASS:   Class II INDICATIONS:positive family history of colon cancer in maternal grandmother and maternal aunt.  Prior colonoscopy in 1998 showed tubular adenoma.  Last colonoscopy in 2010 no polyps, she is having lower abdominal pain. MEDICATIONS: These medications were titrated to patient response per physician's verbal order, Fentanyl 75 mcg IV, and Versed 4 mg IV  DESCRIPTION OF PROCEDURE:   After the risks benefits and alternatives of the procedure were thoroughly explained, informed consent was obtained.  A digital rectal exam revealed no abnormalities of the rectum.   The Pentax Ped Colon [id number] endoscope was introduced through the anus and advanced to the cecum, which was identified by both the appendix and ileocecal valve. No adverse events experienced.   The quality of the prep was good, using MoviPrep  The instrument was then slowly withdrawn as the colon was fully examined.      COLON FINDINGS: There was moderate diverticulosis noted throughout the entire examined colon with associated tortuosity.  Retroflexed views revealed no abnormalities. The time to cecum=9 minutes 45 seconds.  Withdrawal time=6 minutes 35 seconds.  The scope was withdrawn and the procedure completed. COMPLICATIONS: There were no  complications.  ENDOSCOPIC IMPRESSION: There was moderate diverticulosis noted throughout the entire examined colon  RECOMMENDATIONS: high fiber diet Levsin sublingually 0.125 mg every 4 hours when necessary Recall colonoscopy in 10 years Metamucil 1 teaspoon daily  eSigned:  Lafayette Dragon, MD 05/31/2013 11:30 AM   cc:   PATIENT NAME:  Leota, Maka MR#: [id number]

## 2013-05-31 NOTE — Interval H&P Note (Signed)
History and Physical Interval Note:  05/31/2013 9:55 AM  Jasmine Stephens  has presented today for surgery, with the diagnosis of Encounter for screening colonoscopy [V76.51]  The various methods of treatment have been discussed with the patient and family. After consideration of risks, benefits and other options for treatment, the patient has consented to  Procedure(s): ESOPHAGOGASTRODUODENOSCOPY (EGD) (N/A) COLONOSCOPY (N/A) as a surgical intervention .  The patient's history has been reviewed, patient examined, no change in status, stable for surgery.  I have reviewed the patient's chart and labs.  Questions were answered to the patient's satisfaction.     Delfin Edis

## 2013-05-31 NOTE — Op Note (Signed)
[facility] [place], [address]   ENDOSCOPY PROCEDURE REPORT  PATIENT: Jasmine, Stephens  MR#: [id number] BIRTHDATE: 08/16/1950 , 62  yrs. old GENDER: Female ENDOSCOPIST: Lafayette Dragon, MD REFERRED BY:  Cari Caraway, M.D. PROCEDURE DATE:  05/31/2013 PROCEDURE:  EGD, diagnostic and Maloney dilation of esophagus ASA CLASS:     Class II INDICATIONS:  Dysphagia.   History of esophageal reflux. MEDICATIONS: These medications were titrated to patient response per physician's verbal order, Fentanyl 75 mcg IV, and Versed 6 mg IV TOPICAL ANESTHETIC: Cetacaine Spray  DESCRIPTION OF PROCEDURE: After the risks benefits and alternatives of the procedure were thoroughly explained, informed consent was obtained.  The Pentax Gastroscope [id number] endoscope was introduced through the mouth and advanced to the second portion of the duodenum. Without limitations.  The instrument was slowly withdrawn as the mucosa was fully examined.      Esophagus, proximal mid and distal esophageal mucosa was normal. There was a mild distal esophageal stricture which was nonobstructing and it allowed the endoscope to traverse into stomach. There was no esophagitis. there was a small 2 cm sliding hiatal hernia Stomach: Gastric mucosa appeared normal. Rugal force were unremarkable. Gastric outlet and pylorus were normal. Retroflexion of the endoscope revealed normal fundus and cardia Duodenum: Duodenal bulb and descending duodenum was normal Maloney dilators 36 French passed through the esophageal stricture without difficulty. There was no blood on the dilator[         The scope was then withdrawn from the patient and the procedure completed.  COMPLICATIONS: There were no complications. ENDOSCOPIC IMPRESSION:  Mild  benign-appearing distal esophageal stricture. Status post dilation to 28 [place] no evidence of gastritis RECOMMENDATIONS: 1.  Anti-reflux regimen to be follow 2.   Continue PPI bid, may reduce to qd when symptoms controlled 3.  proceed with colonoscopy  REPEAT EXAM: no  eSigned:  Lafayette Dragon, MD 05/31/2013 11:23 AM   CC:  PATIENT NAME:  Jasmine, Stephens MR#: [id number]

## 2013-06-01 ENCOUNTER — Encounter (HOSPITAL_COMMUNITY): Payer: Self-pay | Admitting: Internal Medicine

## 2013-06-22 ENCOUNTER — Encounter: Payer: Self-pay | Admitting: Internal Medicine

## 2013-06-22 NOTE — Progress Notes (Signed)
Case ID: [id number] Member Number: [id number]  Case Type: Initial Review Case Start Date: 06/22/2013  Case Status: Coverage has been APPROVED. You will receive a confirmation letter confirming approval of this medication. The patient will also be notified of this approval via an automated outbound phone call or a letter. Please allow approximately 2 hours to update our system with the approval. Once updated, the prescription can be re-submitted.   Coverage  Start Date: 06/01/2013 Coverage  End Date: 06/22/2014   Patient  First Name: Jasmine Patient  Last Name: Stephens DOB: June 19, 1950  Patient  Street Address: [address]  Patient City: [place] Patient State: [place] Patient Zip: [phone]   Drug Name  & Strength: ESOMEPRAZOLE MAGNESIUM 40 MG CAPSULE DR

## 2013-08-30 ENCOUNTER — Telehealth: Payer: Self-pay | Admitting: Internal Medicine

## 2013-08-30 NOTE — Telephone Encounter (Signed)
Advised patient that per Merrilee Seashore, rx is ready for fill and they will have rx ready within the next hour but "she may not like the copay ($67). Patient states that she was originally told that rx needed prior auth and that they had been waiting for a response from [place]. I advised that we never heard anything from her pharmacy and advised that we actually got insurance authorization until 05/2014 already. She verbalizes understanding.

## 2013-09-07 ENCOUNTER — Telehealth: Payer: Self-pay | Admitting: Internal Medicine

## 2013-09-07 NOTE — Telephone Encounter (Signed)
Glad she got a more reasonable price on the Nexium after the copay card.

## 2013-09-07 NOTE — Telephone Encounter (Signed)
Patient called back. She was able to get Nexium for $12 with copay card. She states that she has actually been having increased GERD symptoms recently but has started taking Nexium twice daily now. She also states that she has alternating constipation and diarrhea (which I told her is common for IBS patients). She complains of lower abdominal cramping with bowel movements. I asked if she has been taking her glycopyrrolate or hyoscyamine. She tells me she has not. I advised her to take one or the other of these antispasmodics to see if this helps her cramping. If she continues to have problems, she is to call us back.

## 2013-09-21 ENCOUNTER — Telehealth: Payer: Self-pay | Admitting: Internal Medicine

## 2013-09-21 NOTE — Telephone Encounter (Signed)
Patient states that she got a letter from her insurance company stating that her Nexium would cost her more money unless a prior authorization was completed by her doctors office. I have advised patient that a prior authorization was already received until 05/2014 (as per 06/22/13 documentation note) and that we also sent a coupon to her pharmacy for her copay to be lessened to $12.00 (which we did 08/2013). She verbalizes understanding.

## 2013-09-23 ENCOUNTER — Telehealth: Payer: Self-pay | Admitting: Internal Medicine

## 2013-09-23 ENCOUNTER — Other Ambulatory Visit: Payer: Self-pay | Admitting: Internal Medicine

## 2013-09-23 NOTE — Telephone Encounter (Signed)
Patient states her pharmacy told her she did not have any more refills for Nexium and they would contact us for more refills. Told patient I did answer the automatic refill request in our system and gave her several refills. Also patient wants to know if the price is going back up to 60 dollar co-pay because Carla Drape has given in the past the pharmacy a coupon that makes her Nexium only 12 dollars. I told patient that I believe the coupon can be used multiple times but if it cannot and they need another coupon, then to contact our office again to get another coupon. Pt agreed.

## 2013-09-27 ENCOUNTER — Telehealth: Payer: Self-pay | Admitting: *Deleted

## 2013-09-27 NOTE — Telephone Encounter (Signed)
Left message for Jasmine Stephens to call back to discuss the following... I have the case ID for the prior authorization that we received 06/22/13 for Nexium. I have contacted Merrilee Seashore at [facility] (which we also spoke to about this same thing on 08/30/2013) to advise that we already have a prior auth from 05/2013 until 05/2014. He states that when they run the rx for today, it still says a prior authorization is needed. I have asked if he would like me to provide him with the authorization # for the approval. He states "that's not going to help [place] any." I asked if he could call the pharmacy help desk since it looks like everything has been done on our end. He states "well, we will work on it" and hung up the phone.

## 2013-09-27 NOTE — Telephone Encounter (Signed)
I have advised patient of the information below. She will call me back if she has not heard anything from the pharmacy as of lunchtime tomorrow and I will try to contact insurance myself for the pharmacy.

## 2013-09-27 NOTE — Telephone Encounter (Signed)
error 

## 2013-09-27 NOTE — Telephone Encounter (Signed)
Prior auth in process

## 2013-09-30 ENCOUNTER — Telehealth: Payer: Self-pay | Admitting: Internal Medicine

## 2013-09-30 MED ORDER — ESOMEPRAZOLE MAGNESIUM 40 MG PO CPDR: 40.0000 mg | DELAYED_RELEASE_CAPSULE | Freq: Two times a day (BID) | ORAL | Status: DC

## 2013-09-30 NOTE — Telephone Encounter (Signed)
I have contacted patient's insurance company again. Per Ronalee Belts at [facility], it appears that the quantity was approved prior to the medication itself. It had nothing to do with name brand vs generic Nexium as the pharmacy told the patient. Jasma deactivated the previous authorization so there will be no further confusion and she started a new authorization for both the medication and quantity. Both have been approved until 09/30/14. I advised patient of this. She would now like her script sent to [facility] instead. I have spoken to [facility] and have given him the Nexium script, insurance information and Nexium coupon. He states that rx went through for 15 dollars per month. Patient has been advised of this.

## 2014-02-02 ENCOUNTER — Emergency Department (HOSPITAL_COMMUNITY): Payer: BC Managed Care – PPO

## 2014-02-02 ENCOUNTER — Emergency Department (HOSPITAL_COMMUNITY)
Admission: EM | Admit: 2014-02-02 | Discharge: 2014-02-02 | Disposition: A | Discharge status: Home / Self Care | Payer: BC Managed Care – PPO | Attending: Emergency Medicine | Admitting: Emergency Medicine

## 2014-02-02 ENCOUNTER — Encounter (HOSPITAL_COMMUNITY): Payer: Self-pay | Admitting: Emergency Medicine

## 2014-02-02 DIAGNOSIS — M545 Low back pain, unspecified: Secondary | ICD-10-CM

## 2014-02-02 DIAGNOSIS — Z880 Allergy status to penicillin: Secondary | ICD-10-CM | POA: Insufficient documentation

## 2014-02-02 DIAGNOSIS — Z79899 Other long term (current) drug therapy: Secondary | ICD-10-CM | POA: Diagnosis not present

## 2014-02-02 DIAGNOSIS — K219 Gastro-esophageal reflux disease without esophagitis: Secondary | ICD-10-CM | POA: Diagnosis not present

## 2014-02-02 DIAGNOSIS — S0990XA Unspecified injury of head, initial encounter: Secondary | ICD-10-CM | POA: Diagnosis present

## 2014-02-02 DIAGNOSIS — Z7952 Long term (current) use of systemic steroids: Secondary | ICD-10-CM | POA: Diagnosis not present

## 2014-02-02 DIAGNOSIS — Y929 Unspecified place or not applicable: Secondary | ICD-10-CM | POA: Diagnosis not present

## 2014-02-02 DIAGNOSIS — F329 Major depressive disorder, single episode, unspecified: Secondary | ICD-10-CM | POA: Insufficient documentation

## 2014-02-02 DIAGNOSIS — W108XXA Fall (on) (from) other stairs and steps, initial encounter: Secondary | ICD-10-CM | POA: Diagnosis not present

## 2014-02-02 DIAGNOSIS — S0083XA Contusion of other part of head, initial encounter: Secondary | ICD-10-CM | POA: Insufficient documentation

## 2014-02-02 DIAGNOSIS — Z86010 Personal history of colonic polyps: Secondary | ICD-10-CM | POA: Diagnosis not present

## 2014-02-02 DIAGNOSIS — Y998 Other external cause status: Secondary | ICD-10-CM | POA: Insufficient documentation

## 2014-02-02 DIAGNOSIS — F419 Anxiety disorder, unspecified: Secondary | ICD-10-CM | POA: Insufficient documentation

## 2014-02-02 DIAGNOSIS — M436 Torticollis: Secondary | ICD-10-CM

## 2014-02-02 DIAGNOSIS — Z791 Long term (current) use of non-steroidal anti-inflammatories (NSAID): Secondary | ICD-10-CM | POA: Diagnosis not present

## 2014-02-02 DIAGNOSIS — W19XXXA Unspecified fall, initial encounter: Secondary | ICD-10-CM

## 2014-02-02 DIAGNOSIS — S300XXA Contusion of lower back and pelvis, initial encounter: Secondary | ICD-10-CM | POA: Diagnosis not present

## 2014-02-02 DIAGNOSIS — Y9389 Activity, other specified: Secondary | ICD-10-CM | POA: Diagnosis not present

## 2014-02-02 DIAGNOSIS — S199XXA Unspecified injury of neck, initial encounter: Secondary | ICD-10-CM | POA: Diagnosis not present

## 2014-02-02 DIAGNOSIS — S0093XA Contusion of unspecified part of head, initial encounter: Secondary | ICD-10-CM

## 2014-02-02 MED ORDER — CYCLOBENZAPRINE HCL 10 MG PO TABS: 10.0000 mg | ORAL_TABLET | Freq: Three times a day (TID) | ORAL | Status: DC | PRN

## 2014-02-02 MED ORDER — CYCLOBENZAPRINE HCL 10 MG PO TABS
10.0000 mg | ORAL_TABLET | Freq: Once | ORAL | Status: AC
  Administered 2014-02-02: 10 mg via ORAL
  Filled 2014-02-02: qty 1

## 2014-02-02 MED ORDER — KETOROLAC TROMETHAMINE 60 MG/2ML IM SOLN
60.0000 mg | Freq: Once | INTRAMUSCULAR | Status: AC
  Administered 2014-02-02: 60 mg via INTRAMUSCULAR
  Filled 2014-02-02: qty 2

## 2014-02-02 MED ORDER — NAPROXEN 500 MG PO TABS: 500.0000 mg | ORAL_TABLET | Freq: Two times a day (BID) | ORAL | Status: DC

## 2014-02-02 MED ORDER — TRAMADOL HCL 50 MG PO TABS
100.0000 mg | ORAL_TABLET | Freq: Four times a day (QID) | ORAL | Status: DC | PRN
Start: 2014-02-02 — End: 2015-05-10

## 2014-02-02 NOTE — Discharge Instructions (Signed)
You will need to sit on a pillow or get a "donut" at [place] to sit on until your bruised tailbone feels better. Use ice for pain, use heat to relax your stiff neck muscles and for your back. Take the medications as prescribed. Expect to be sore for 10-14 days. If you continue to have pain you can be rechecked by your doctors.  Return to the ED for any problems listed on the head injury sheet.

## 2014-02-02 NOTE — ED Notes (Signed)
Pt fell down 7 steps this am. Pt reports she hit her head and her tailbone, unknown if LOC.

## 2014-02-02 NOTE — ED Notes (Signed)
Pt alert & oriented x4, stable gait. Patient given discharge instructions, paperwork & prescription(s). Patient  instructed to stop at the registration desk to finish any additional paperwork. Patient verbalized understanding. Pt left department w/ no further questions. 

## 2014-02-02 NOTE — ED Provider Notes (Signed)
CSN: [id number]     Arrival date & time 02/02/14  1131 History   First MD Initiated Contact with Patient 02/02/14 1235     Chief Complaint  Patient presents with  . Fall     (Consider location/radiation/quality/duration/timing/severity/associated sxs/prior Treatment) HPI  Patient reports she has been having problems with her knees and sometimes her knee gives out on her. She states today about 8:30 AM she started to go down a flight to 14 steps however after she stepped off the top step she fell and slid down about 7 steps. She states her steps are carpeted but she was barefoot.She states she hit her head on the top step. She did not have loss of consciousness but states she felt dizzy. She states she landed on her buttocks. She complains of pain in her buttocks when she sits. She has neck pain, pain in the back of her head where she hit it, low back pain and pain in her tailbone. She had nausea earlier but not now. She states she has a burning feeling in the back of her head. She denies numbness or tingling in her extremities. She denies any blurred vision now although she had mild earlier.  Patient has a history of prior back surgery. She states she's has seen Dr. Noemi Chapel, orthopedics and he has injected her knees without improvement.  PCP Dr Carlis Stable Neurosurgeon Dr Sherwood Gambler  Past Medical History  Diagnosis Date  . Irritable bowel syndrome   . GERD (gastroesophageal reflux disease)   . Diverticulosis   . Colon polyp   . Anxiety and depression   . Anxiety   . Depression    Past Surgical History  Procedure Laterality Date  . Cholecystectomy    . Colonoscopy    . Upper gastrointestinal endoscopy    . Partial hysterectomy    . Back surgery    . Knee arthroscopy      left  . Esophagogastroduodenoscopy N/A 05/31/2013    Procedure: ESOPHAGOGASTRODUODENOSCOPY (EGD);  Surgeon: Lafayette Dragon, MD;  Location: [facility];  Service: Endoscopy;  Laterality: N/A;  . Colonoscopy N/A 05/31/2013     Procedure: COLONOSCOPY;  Surgeon: Lafayette Dragon, MD;  Location: [facility];  Service: Endoscopy;  Laterality: N/A;  Venia Minks dilation  05/31/2013    Procedure: Venia Minks DILATION;  Surgeon: Lafayette Dragon, MD;  Location: [facility];  Service: Endoscopy;;   Family History  Problem Relation Age of Onset  . Heart disease Mother   . Heart disease Father   . Colon cancer Paternal Grandmother   . Heart disease Brother   . Colon cancer Paternal Aunt    History  Substance Use Topics  . Smoking status: Never Smoker   . Smokeless tobacco: Never Used  . Alcohol Use: Yes   Employed Lives at home Lives with spouse   OB History    Gravida Para Term Preterm AB TAB SAB Ectopic Multiple Living   3 3 3             Review of Systems  All other systems reviewed and are negative.     Allergies  Compazine; Chlorpromazine hcl; Penicillins; and Tape  Home Medications   Prior to Admission medications   Medication Sig Start Date End Date Taking? Authorizing Provider  esomeprazole (NEXIUM) 40 MG capsule Take 1 capsule (40 mg total) by mouth 2 (two) times daily. 09/30/13  Yes Lafayette Dragon, MD  glucosamine-chondroitin 500-400 MG tablet Take 1 tablet by mouth daily.  Yes Historical Provider, MD  glycopyrrolate (ROBINUL) 2 MG tablet Take 1 tablet (2 mg total) by mouth daily as needed (for stomach cramping). 05/07/13  Yes Lafayette Dragon, MD  hyoscyamine (LEVSIN, ANASPAZ) 0.125 MG tablet Take 0.125 mg by mouth 2 (two) times daily as needed for cramping.   Yes Historical Provider, MD  mometasone (NASONEX) 50 MCG/ACT nasal spray Place 2 sprays into the nose daily.   Yes Historical Provider, MD  Multiple Vitamin (MULTIVITAMIN WITH MINERALS) TABS tablet Take 1 tablet by mouth daily.   Yes Historical Provider, MD  naproxen sodium (ANAPROX) 220 MG tablet Take 660 mg by mouth at bedtime as needed (pain).   Yes Historical Provider, MD  Omega-3 Fatty Acids (FISH OIL PO) Take 2 capsules by mouth daily.   Yes  Historical Provider, MD  VITAMIN D, ERGOCALCIFEROL, PO Take 1 tablet by mouth daily.   Yes Historical Provider, MD  cyclobenzaprine (FLEXERIL) 10 MG tablet Take 1 tablet (10 mg total) by mouth 3 (three) times daily as needed for muscle spasms. 02/02/14   Janice Norrie, MD  hyoscyamine (LEVSIN/SL) 0.125 MG SL tablet Place 1 tablet (0.125 mg total) under the tongue every 4 (four) hours as needed for cramping. Patient not taking: Reported on 02/02/2014 05/31/13   Lafayette Dragon, MD  LORazepam (ATIVAN) 0.5 MG tablet Take 0.5 mg by mouth every 8 (eight) hours as needed for anxiety.    Historical Provider, MD  naproxen (NAPROSYN) 500 MG tablet Take 1 tablet (500 mg total) by mouth 2 (two) times daily. 02/02/14   Janice Norrie, MD  pseudoephedrine-guaifenesin (MUCINEX D) 60-600 MG per tablet Take 1 tablet by mouth 2 (two) times daily as needed for congestion.    Historical Provider, MD  Soft Lens Products (RA SALINE SOLUTION) SOLN Place 1 spray into the nose at bedtime.    Historical Provider, MD  traMADol (ULTRAM) 50 MG tablet Take 2 tablets (100 mg total) by mouth every 6 (six) hours as needed. 02/02/14   Janice Norrie, MD   BP 129/59 mmHg  Pulse 93  Temp(Src) 98.1 F (36.7 C) (Oral)  Resp 16  Ht 5\' 6"  (1.676 m)  Wt 180 lb (81.647 kg)  BMI 29.07 kg/m2  SpO2 97%  Vital signs normal   Physical Exam  Constitutional: She is oriented to person, place, and time. She appears well-developed and well-nourished.  Non-toxic appearance. She does not appear ill. No distress.  Pt laying on her abdomen  HENT:  Head: Normocephalic and atraumatic.    Right Ear: External ear normal.  Left Ear: External ear normal.  Nose: Nose normal. No mucosal edema or rhinorrhea.  Mouth/Throat: Oropharynx is clear and moist and mucous membranes are normal. No dental abscesses or uvula swelling.  Eyes: Conjunctivae and EOM are normal. Pupils are equal, round, and reactive to light.  Neck: Normal range of motion and full passive  range of motion without pain. Neck supple.    Tender diffusely in the paraspinous muscles, moves her head freely during her exam.   Cardiovascular: Normal rate, regular rhythm and normal heart sounds.  Exam reveals no gallop and no friction rub.   No murmur heard. Pulmonary/Chest: Effort normal and breath sounds normal. No respiratory distress. She has no wheezes. She has no rhonchi. She has no rales. She exhibits no tenderness and no crepitus.  Abdominal: Soft. Normal appearance and bowel sounds are normal. She exhibits no distension. There is no tenderness. There is no rebound and no  guarding.  Musculoskeletal: Normal range of motion. She exhibits no edema or tenderness.       Back:  Moves all extremities well. Tender in her lower lumbar spine but also very tender over her coccyx without bruising.   Neurological: She is alert and oriented to person, place, and time. She has normal strength. No cranial nerve deficit.  Skin: Skin is warm, dry and intact. No rash noted. No erythema. No pallor.  Psychiatric: She has a normal mood and affect. Her speech is normal and behavior is normal. Her mood appears not anxious.  Nursing note and vitals reviewed.   ED Course  Procedures (including critical care time)  Medications  ketorolac (TORADOL) injection 60 mg (60 mg Intramuscular Given 02/02/14 1320)  cyclobenzaprine (FLEXERIL) tablet 10 mg (10 mg Oral Given 02/02/14 1320)    At discharge, still laying on her abdomen. Moving head freely. Discussed sitting on a pillow or getting a donut to sit on. To return for head injury symptoms.    Labs Review Labs Reviewed - No data to display  Imaging Review Dg Cervical Spine Complete  02/02/2014   CLINICAL DATA:  Neck pain.  Patient fell down steps recently  EXAM: CERVICAL SPINE  4+ VIEWS  COMPARISON:  None.  FINDINGS: Frontal, lateral, open-mouth odontoid, and bilateral oblique views were obtained. There is no fracture or spondylolisthesis.  Prevertebral soft tissues and predental space regions are normal. There is moderate disc space narrowing at C4-5, C5-6, and C6-7. There is exit foraminal narrowing due to facet osteoarthritic change at C5-6 bilaterally.  IMPRESSION: Osteoarthritic changes several levels, most notably at C5-6. No fracture or spondylolisthesis.   Electronically Signed   By: Lowella Grip M.D.   On: 02/02/2014 14:46   Dg Lumbar Spine Complete  02/02/2014   CLINICAL DATA:  Low back pain following fall down stairs, initial encounter  EXAM: LUMBAR SPINE - COMPLETE 4+ VIEW  COMPARISON:  None.  FINDINGS: Five lumbar type vertebral bodies are well visualized. Postsurgical changes are noted at L5-S1. Mild osteophytic changes are seen no spondylolysis or spondylolisthesis is seen. No definitive compression deformity is noted. No hardware failure is seen.  IMPRESSION: Mild degenerative and postoperative changes. No acute abnormality is noted.   Electronically Signed   By: Inez Catalina M.D.   On: 02/02/2014 14:44   Dg Sacrum/coccyx  02/02/2014   CLINICAL DATA:  Low back pain. Pain radiating down the LEFT leg. Fall today. Coccygeal pain. Initial encounter.  EXAM: SACRUM AND COCCYX - 2+ VIEW  COMPARISON:  None.  FINDINGS: No fracture. Sacrococcygeal junction appears within normal limits. L5-S1 posterior lumbar interbody fusion.  IMPRESSION: No acute abnormality.   Electronically Signed   By: Dereck Ligas M.D.   On: 02/02/2014 14:47   Ct Head Wo Contrast  02/02/2014   CLINICAL DATA:  [place] down 7 steps today, head pain, no loss of consciousness, initial encounter  EXAM: CT HEAD WITHOUT CONTRAST  TECHNIQUE: Contiguous axial images were obtained from the base of the skull through the vertex without intravenous contrast.  COMPARISON:  10/19/2010  FINDINGS: Normal ventricular morphology.  No midline shift or mass effect.  Normal appearance of brain parenchyma.  No intracranial hemorrhage, mass lesion, or acute infarction.  Visualized  paranasal sinuses and mastoid air cells clear.  Bones unremarkable.  IMPRESSION: Normal exam.   Electronically Signed   By: Lavonia Dana M.D.   On: 02/02/2014 13:53     EKG Interpretation None      MDM  Final diagnoses:  Fall  Fall down steps, initial encounter  Contusion of coccyx, initial encounter  Acute low back pain  Contusion of head, initial encounter  Neck stiffness    New Prescriptions   CYCLOBENZAPRINE (FLEXERIL) 10 MG TABLET    Take 1 tablet (10 mg total) by mouth 3 (three) times daily as needed for muscle spasms.   NAPROXEN (NAPROSYN) 500 MG TABLET    Take 1 tablet (500 mg total) by mouth 2 (two) times daily.   TRAMADOL (ULTRAM) 50 MG TABLET    Take 2 tablets (100 mg total) by mouth every 6 (six) hours as needed.    Plan discharge  Rolland Porter, MD, Alanson Aly, MD 02/02/14 [id number]

## 2014-08-05 ENCOUNTER — Ambulatory Visit: Payer: BC Managed Care – PPO

## 2014-08-05 ENCOUNTER — Ambulatory Visit (INDEPENDENT_AMBULATORY_CARE_PROVIDER_SITE_OTHER): Payer: BC Managed Care – PPO

## 2014-08-05 ENCOUNTER — Encounter (INDEPENDENT_AMBULATORY_CARE_PROVIDER_SITE_OTHER): Payer: Self-pay

## 2014-08-05 ENCOUNTER — Ambulatory Visit (INDEPENDENT_AMBULATORY_CARE_PROVIDER_SITE_OTHER): Payer: BC Managed Care – PPO | Admitting: Family Medicine

## 2014-08-05 ENCOUNTER — Encounter: Payer: Self-pay | Admitting: Family Medicine

## 2014-08-05 VITALS — BP 135/78 | HR 75 | Temp 97.0°F | Ht 66.0 in | Wt 211.0 lb

## 2014-08-05 DIAGNOSIS — R05 Cough: Secondary | ICD-10-CM

## 2014-08-05 DIAGNOSIS — M79674 Pain in right toe(s): Secondary | ICD-10-CM | POA: Diagnosis not present

## 2014-08-05 DIAGNOSIS — J329 Chronic sinusitis, unspecified: Secondary | ICD-10-CM | POA: Diagnosis not present

## 2014-08-05 DIAGNOSIS — R059 Cough, unspecified: Secondary | ICD-10-CM

## 2014-08-05 DIAGNOSIS — J209 Acute bronchitis, unspecified: Secondary | ICD-10-CM | POA: Diagnosis not present

## 2014-08-05 LAB — POCT CBC
GRANULOCYTE PERCENT: 50.6 % (ref 37–80)
HEMATOCRIT: 44.9 % (ref 37.7–47.9)
HEMOGLOBIN: 13.9 g/dL (ref 12.2–16.2)
LYMPH, POC: 3.6 — AB (ref 0.6–3.4)
MCH, POC: 27 pg (ref 27–31.2)
MCHC: 31.1 g/dL — AB (ref 31.8–35.4)
MCV: 86.8 fL (ref 80–97)
MPV: 8 fL (ref 0–99.8)
POC Granulocyte: 4.3 (ref 2–6.9)
POC LYMPH PERCENT: 41.8 %L (ref 10–50)
Platelet Count, POC: 365 10*3/uL (ref 142–424)
RBC: 5.17 M/uL (ref 4.04–5.48)
RDW, POC: 12.7 %
WBC: 8.5 10*3/uL (ref 4.6–10.2)

## 2014-08-05 MED ORDER — CLARITHROMYCIN 500 MG PO TABS: 500.0000 mg | ORAL_TABLET | Freq: Two times a day (BID) | ORAL | Status: DC

## 2014-08-05 NOTE — Progress Notes (Signed)
Subjective:    Patient ID: Jasmine Stephens, female    DOB: 09-02-1950, 64 y.o.   MRN: [id number]  HPI Patient here today for cough, sinus and drainage that started on Sunday. She is also wanting [place] to check her recent broken toe. The patient has malaise headache and has been feeling bad for several days. She is coughing up some yellow sputum. She says the drainage from her head is mostly clear. She is on he had x-rays of her toe and these were reviewed and everything appears to be being treated properly. She has questions about pneumonia shot and I told her it would be a good idea to come back after she is well and get a Prevnar vaccine.    Patient Active Problem List   Diagnosis Date Noted  . Family history of malignant neoplasm of gastrointestinal tract 05/31/2013  . Stricture and stenosis of esophagus 05/31/2013  . ANXIETY 05/17/2010  . DIVERTICULITIS-COLON 05/17/2010  . CONSTIPATION 05/17/2010  . ABDOMINAL PAIN-LLQ 02/03/2009  . DIVERTICULOSIS, COLON 02/02/2009  . COLONIC POLYPS, HYPERPLASTIC, HX OF 02/02/2009  . GERD 08/09/2008  . IRRITABLE BOWEL SYNDROME 08/09/2008  . DYSPHAGIA UNSPECIFIED 08/09/2008  . DIARRHEA 08/09/2008  . CHANGE IN BOWELS 08/09/2008  . ABDOMINAL PAIN-MULTIPLE SITES 08/09/2008   Outpatient Encounter Prescriptions as of 08/05/2014  Medication Sig  . esomeprazole (NEXIUM) 40 MG capsule Take 1 capsule (40 mg total) by mouth 2 (two) times daily.  Marland Kitchen glucosamine-chondroitin 500-400 MG tablet Take 1 tablet by mouth daily.  Marland Kitchen glycopyrrolate (ROBINUL) 2 MG tablet Take 1 tablet (2 mg total) by mouth daily as needed (for stomach cramping).  . mometasone (NASONEX) 50 MCG/ACT nasal spray Place 2 sprays into the nose daily.  . Multiple Vitamin (MULTIVITAMIN WITH MINERALS) TABS tablet Take 1 tablet by mouth daily.  . Omega-3 Fatty Acids (FISH OIL PO) Take 2 capsules by mouth daily.  . traMADol (ULTRAM) 50 MG tablet Take 2 tablets (100 mg total) by mouth every 6 (six) hours as  needed.  Marland Kitchen VITAMIN D, ERGOCALCIFEROL, PO Take 1 tablet by mouth daily.  . [DISCONTINUED] cyclobenzaprine (FLEXERIL) 10 MG tablet Take 1 tablet (10 mg total) by mouth 3 (three) times daily as needed for muscle spasms.  . [DISCONTINUED] LORazepam (ATIVAN) 0.5 MG tablet Take 0.5 mg by mouth every 8 (eight) hours as needed for anxiety.  . [DISCONTINUED] naproxen (NAPROSYN) 500 MG tablet Take 1 tablet (500 mg total) by mouth 2 (two) times daily.  . [DISCONTINUED] naproxen sodium (ANAPROX) 220 MG tablet Take 660 mg by mouth at bedtime as needed (pain).  . [DISCONTINUED] pseudoephedrine-guaifenesin (MUCINEX D) 60-600 MG per tablet Take 1 tablet by mouth 2 (two) times daily as needed for congestion.  . [DISCONTINUED] Soft Lens Products (RA SALINE SOLUTION) SOLN Place 1 spray into the nose at bedtime.  . hyoscyamine (LEVSIN/SL) 0.125 MG SL tablet Place 1 tablet (0.125 mg total) under the tongue every 4 (four) hours as needed for cramping. (Patient not taking: Reported on 02/02/2014)  . [DISCONTINUED] hyoscyamine (LEVSIN, ANASPAZ) 0.125 MG tablet Take 0.125 mg by mouth 2 (two) times daily as needed for cramping.   No facility-administered encounter medications on file as of 08/05/2014.        Review of Systems  Constitutional: Negative.   HENT: Positive for congestion, ear pain, postnasal drip and sinus pressure.   Eyes: Negative.   Respiratory: Positive for cough.   Cardiovascular: Negative.   Gastrointestinal: Negative.   Endocrine: Negative.   Genitourinary: Negative.  Musculoskeletal: Positive for arthralgias (right foot- toe pain).  Skin: Negative.   Allergic/Immunologic: Negative.   Neurological: Positive for headaches.  Hematological: Negative.   Psychiatric/Behavioral: Negative.        Objective:   Physical Exam  Constitutional: She is oriented to person, place, and time. She appears well-developed and well-nourished. No distress.  HENT:  Head: Normocephalic and atraumatic.    Right Ear: External ear normal.  Left Ear: External ear normal.  Mouth/Throat: Oropharynx is clear and moist. No oropharyngeal exudate.  The patient has nasal congestion bilaterally right greater than left and has bilateral frontal sinus tenderness ethmoid sinus and right maxillary sinus tenderness  Eyes: Conjunctivae and EOM are normal. Pupils are equal, round, and reactive to light. Right eye exhibits no discharge. Left eye exhibits no discharge. No scleral icterus.  Neck: Normal range of motion. Neck supple. No thyromegaly present.  Cardiovascular: Normal rate, regular rhythm and normal heart sounds.   No murmur heard. Pulmonary/Chest: Effort normal and breath sounds normal. No respiratory distress. She has no wheezes. She has no rales.  Mostly a dry cough and slight congestion with coughing  Musculoskeletal: Normal range of motion. She exhibits no edema.  The patient is wearing a fracture shoe and has the second toe buddy taped to the third toe and looks appears to be good management for her broken toe and she should wear the this for at least 4-6 weeks  Neurological: She is alert and oriented to person, place, and time.  Skin: Skin is warm and dry. No rash noted.  Psychiatric: She has a normal mood and affect. Her behavior is normal. Judgment and thought content normal.  Nursing note and vitals reviewed.  BP 135/78 mmHg  Pulse 75  Temp(Src) 97 F (36.1 C) (Oral)  Ht 5\' 6"  (1.676 m)  Wt 211 lb (95.709 kg)  BMI 34.07 kg/m2  Results for orders placed or performed in visit on 08/05/14  POCT CBC  Result Value Ref Range   WBC 8.5 4.6 - 10.2 K/uL   Lymph, poc 3.6 (A) 0.6 - 3.4   POC LYMPH PERCENT 41.8 10 - 50 %L   POC Granulocyte 4.3 2 - 6.9   Granulocyte percent 50.6 37 - 80 %G   RBC 5.17 4.04 - 5.48 M/uL   Hemoglobin 13.9 12.2 - 16.2 g/dL   HCT, POC 44.9 37.7 - 47.9 %   MCV 86.8 80 - 97 fL   MCH, POC 27.0 27 - 31.2 pg   MCHC 31.1 (A) 31.8 - 35.4 g/dL   RDW, POC 12.7 %    Platelet Count, POC 365 142 - 424 K/uL   MPV 8.0 0 - 99.8 fL   The patient was made aware of this result before she left the office.  [facility] reading (PRIMARY) by  Dr. Brunilda Payor x-ray-no active disease                                     Assessment & Plan:  1. Cough -There were no gross abnormalities on the chest x-ray and it appears normal although we are waiting an over read by the radiologist -She will take Mucinex for the cough and use nasal saline - DG Chest 2 View; Future - POCT CBC  2. Toe pain, right -Continue to buddy tape the toes together and wear the fracture shoe  3. Rhinosinusitis -Use nasal saline and she'll use an antihistamine of  choice over-the-counter either Zyrtec, Allegra, or Claritin - clarithromycin (BIAXIN) 500 MG tablet; Take 1 tablet (500 mg total) by mouth 2 (two) times daily.  Dispense: 28 tablet; Refill: 0  4. Acute bronchitis, unspecified organism -Drink plenty of fluids and take cough medicine as directed - clarithromycin (BIAXIN) 500 MG tablet; Take 1 tablet (500 mg total) by mouth 2 (two) times daily.  Dispense: 28 tablet; Refill: 0  Patient Instructions  Take Tylenol for aches pains and fever Drink plenty of fluids Take Mucinex, maximum strength, blue and white in color, 1 twice daily for cough and congestion Take antibiotic as directed Use nasal saline 3 or 4 times daily She use an antihistamine to take Zyrtec at bedtime or Allegra or Claritin during the day for head congestion Continue the buddy taping of one toe to the next and using the fracture shoe   Arrie Senate MD

## 2014-08-05 NOTE — Patient Instructions (Addendum)
Take Tylenol for aches pains and fever Drink plenty of fluids Take Mucinex, maximum strength, blue and white in color, 1 twice daily for cough and congestion Take antibiotic as directed Use nasal saline 3 or 4 times daily She use an antihistamine to take Zyrtec at bedtime or Allegra or Claritin during the day for head congestion Continue the buddy taping of one toe to the next and using the fracture shoe

## 2014-09-06 ENCOUNTER — Telehealth: Payer: Self-pay | Admitting: Family Medicine

## 2014-09-06 NOTE — Telephone Encounter (Signed)
When did she sustain the broken toe and where was she seen and how long ago did this happen?

## 2014-09-06 NOTE — Telephone Encounter (Signed)
Spoke with pt regarding continued toe pain appt scheduled with [facility] on Thurs

## 2014-09-08 ENCOUNTER — Ambulatory Visit: Payer: BC Managed Care – PPO | Admitting: Family Medicine

## 2014-09-15 ENCOUNTER — Ambulatory Visit: Payer: BC Managed Care – PPO | Admitting: Family Medicine

## 2014-10-04 NOTE — Progress Notes (Signed)
Patient ID: Jasmine Stephens, female   DOB: [birth year], 64 y.o.   MRN: [id number]   PA approval through cover my meds for Nexium 40 mg with [facility].

## 2014-11-03 ENCOUNTER — Encounter: Payer: Self-pay | Admitting: Internal Medicine

## 2015-01-16 ENCOUNTER — Encounter: Payer: Self-pay | Admitting: Family Medicine

## 2015-01-16 ENCOUNTER — Ambulatory Visit (INDEPENDENT_AMBULATORY_CARE_PROVIDER_SITE_OTHER): Payer: BC Managed Care – PPO | Admitting: Family Medicine

## 2015-01-16 VITALS — BP 118/68 | HR 75 | Temp 97.1°F | Ht 66.0 in | Wt 210.8 lb

## 2015-01-16 DIAGNOSIS — J209 Acute bronchitis, unspecified: Secondary | ICD-10-CM

## 2015-01-16 DIAGNOSIS — J329 Chronic sinusitis, unspecified: Secondary | ICD-10-CM

## 2015-01-16 MED ORDER — AZITHROMYCIN 250 MG PO TABS: ORAL_TABLET | ORAL | Status: DC

## 2015-01-16 NOTE — Progress Notes (Signed)
Subjective:    Patient ID: Jasmine Stephens, female    DOB: [birth year], 65 y.o.   MRN: [id number]  HPI Patient here today with a cough, sinus drainage, and no voice since Friday. She had a fever on Saturday. She lost her voice yesterday morning. She has had some headaches associated with this above the eyes and around the eyes. She cannot attach any color to what she is blowing out of her head or coughing up from her chest. She is also has some ear congestion.   Review of Systems  Constitutional: Positive for fever.  HENT:       Sinus Drainage  Eyes: Negative.   Respiratory: Positive for cough.   Cardiovascular: Negative.   Gastrointestinal: Negative.   Endocrine: Negative.   Musculoskeletal: Negative.   Skin: Negative.   Allergic/Immunologic: Negative.   Neurological: Negative.   Hematological: Negative.   Psychiatric/Behavioral: Negative.         Patient Active Problem List   Diagnosis Date Noted  . Family history of malignant neoplasm of gastrointestinal tract 05/31/2013  . Stricture and stenosis of esophagus 05/31/2013  . ANXIETY 05/17/2010  . DIVERTICULITIS-COLON 05/17/2010  . CONSTIPATION 05/17/2010  . ABDOMINAL PAIN-LLQ 02/03/2009  . DIVERTICULOSIS, COLON 02/02/2009  . COLONIC POLYPS, HYPERPLASTIC, HX OF 02/02/2009  . GERD 08/09/2008  . IRRITABLE BOWEL SYNDROME 08/09/2008  . DYSPHAGIA UNSPECIFIED 08/09/2008  . DIARRHEA 08/09/2008  . CHANGE IN BOWELS 08/09/2008  . ABDOMINAL PAIN-MULTIPLE SITES 08/09/2008   Outpatient Encounter Prescriptions as of 01/16/2015  Medication Sig  . acetaminophen (TYLENOL) 500 MG tablet Take 500 mg by mouth every 6 (six) hours as needed.  Marland Kitchen esomeprazole (NEXIUM) 40 MG capsule Take 1 capsule (40 mg total) by mouth 2 (two) times daily.  Marland Kitchen glucosamine-chondroitin 500-400 MG tablet Take 1 tablet by mouth daily.  Marland Kitchen glycopyrrolate (ROBINUL) 2 MG tablet Take 1 tablet (2 mg total) by mouth daily as needed (for stomach cramping).  . hyoscyamine  (LEVSIN/SL) 0.125 MG SL tablet Place 1 tablet (0.125 mg total) under the tongue every 4 (four) hours as needed for cramping.  . mometasone (NASONEX) 50 MCG/ACT nasal spray Place 2 sprays into the nose daily.  . Multiple Vitamin (MULTIVITAMIN WITH MINERALS) TABS tablet Take 1 tablet by mouth daily.  . Naproxen Sodium (ALEVE) 220 MG CAPS Take by mouth.  . Omega-3 Fatty Acids (FISH OIL PO) Take 2 capsules by mouth daily.  . traMADol (ULTRAM) 50 MG tablet Take 2 tablets (100 mg total) by mouth every 6 (six) hours as needed.  Marland Kitchen VITAMIN D, ERGOCALCIFEROL, PO Take 1 tablet by mouth daily. 2 x per week  . [DISCONTINUED] clarithromycin (BIAXIN) 500 MG tablet Take 1 tablet (500 mg total) by mouth 2 (two) times daily.   No facility-administered encounter medications on file as of 01/16/2015.      Objective:   Physical Exam  Constitutional: She is oriented to person, place, and time. She appears well-developed and well-nourished. No distress.  HENT:  Head: Normocephalic and atraumatic.  Right Ear: External ear normal.  Left Ear: External ear normal.  Mouth/Throat: Oropharynx is clear and moist. No oropharyngeal exudate.  Ethmoid and frontal sinus tenderness  Eyes: Conjunctivae and EOM are normal. Pupils are equal, round, and reactive to light. Right eye exhibits no discharge. Left eye exhibits no discharge. No scleral icterus.  Neck: Normal range of motion. Neck supple. No thyromegaly present.  Anterior cervical tenderness but no adenopathy on the left  Cardiovascular: Normal rate, regular rhythm and  normal heart sounds.   No murmur heard. Pulmonary/Chest: Effort normal and breath sounds normal. No respiratory distress. She has no wheezes. She has no rales.  Coarse upper bronchial sounds with coughing. No rales or wheezing  Abdominal: Bowel sounds are normal.  Musculoskeletal: Normal range of motion.  Lymphadenopathy:    She has no cervical adenopathy.  Neurological: She is alert and oriented to  person, place, and time.  Skin: Skin is warm and dry. No rash noted.  Psychiatric: She has a normal mood and affect. Her behavior is normal. Judgment and thought content normal.  Nursing note and vitals reviewed.  BP 118/68 mmHg  Pulse 75  Temp(Src) 97.1 F (36.2 C) (Oral)  Ht 5\' 6"  (1.676 m)  Wt 210 lb 12.8 oz (95.618 kg)  BMI 34.04 kg/m2        Assessment & Plan:  1. Acute bronchitis, unspecified organism -Take Mucinex as directed and drink plenty of water and fluids - azithromycin (ZITHROMAX) 250 MG tablet; 2 pills the first day then one daily for infection until completed  Dispense: 6 tablet; Refill: 0  2. Rhinosinusitis -Use nasal saline as directed and continue to use Nasonex - azithromycin (ZITHROMAX) 250 MG tablet; 2 pills the first day then one daily for infection until completed  Dispense: 6 tablet; Refill: 0  Patient Instructions  Drink plenty of fluids and stay well hydrated Keep the house as cool as possible and wear more clothing Use nasal saline frequently through the day and can continue to use the Nasonex at nighttime Take Mucinex, blue and white in color, plain, 1 twice daily with a large glass of water with your coughing or not Take antibiotic as directed until completed Gargle with warm salty water Voice rest   Arrie Senate MD

## 2015-01-16 NOTE — Patient Instructions (Signed)
Drink plenty of fluids and stay well hydrated Keep the house as cool as possible and wear more clothing Use nasal saline frequently through the day and can continue to use the Nasonex at nighttime Take Mucinex, blue and white in color, plain, 1 twice daily with a large glass of water with your coughing or not Take antibiotic as directed until completed Gargle with warm salty water Voice rest

## 2015-01-20 ENCOUNTER — Encounter: Payer: Self-pay | Admitting: Family Medicine

## 2015-01-20 ENCOUNTER — Ambulatory Visit (INDEPENDENT_AMBULATORY_CARE_PROVIDER_SITE_OTHER): Payer: BC Managed Care – PPO

## 2015-01-20 ENCOUNTER — Ambulatory Visit (INDEPENDENT_AMBULATORY_CARE_PROVIDER_SITE_OTHER): Payer: BC Managed Care – PPO | Admitting: Family Medicine

## 2015-01-20 VITALS — BP 142/71 | HR 73 | Temp 97.0°F | Ht 66.0 in | Wt 211.4 lb

## 2015-01-20 DIAGNOSIS — E559 Vitamin D deficiency, unspecified: Secondary | ICD-10-CM

## 2015-01-20 DIAGNOSIS — R059 Cough, unspecified: Secondary | ICD-10-CM

## 2015-01-20 DIAGNOSIS — R51 Headache: Secondary | ICD-10-CM

## 2015-01-20 DIAGNOSIS — R05 Cough: Secondary | ICD-10-CM

## 2015-01-20 DIAGNOSIS — J209 Acute bronchitis, unspecified: Secondary | ICD-10-CM

## 2015-01-20 DIAGNOSIS — B349 Viral infection, unspecified: Secondary | ICD-10-CM

## 2015-01-20 DIAGNOSIS — R519 Headache, unspecified: Secondary | ICD-10-CM

## 2015-01-20 DIAGNOSIS — R5383 Other fatigue: Secondary | ICD-10-CM | POA: Diagnosis not present

## 2015-01-20 NOTE — Patient Instructions (Signed)
Continue to drink plenty of fluids and take Mucinex, use nasal saline, gargle with warm salty water and rest We will call the CBC results, the thyroid profile, and the vitamin D results as soon as it becomes available We will also call with the official reading by the radiologist of the chest x-ray report

## 2015-01-20 NOTE — Progress Notes (Signed)
Subjective:    Patient ID: Jasmine Stephens, female    DOB: 1950/06/30, 64 y.o.   MRN: [id number]  HPI  Patient is here today with a cough and hoarse. She was seen on 01/16/2015 and is not feeling any better. The patient has finished 5 days worth of azithromycin. She still coughing has a headache and feels extremely weak and tired. She has had some chills. Basically she feels bad..   Review of Systems  Constitutional: Negative.   HENT: Negative.   Eyes: Negative.   Respiratory: Positive for cough.        And Hoarse   Cardiovascular: Negative.   Gastrointestinal: Negative.   Endocrine: Negative.   Genitourinary: Negative.   Musculoskeletal: Negative.   Skin: Negative.   Allergic/Immunologic: Negative.   Neurological: Negative.   Hematological: Negative.   Psychiatric/Behavioral: Negative.          Patient Active Problem List   Diagnosis Date Noted  . Family history of malignant neoplasm of gastrointestinal tract 05/31/2013  . Stricture and stenosis of esophagus 05/31/2013  . ANXIETY 05/17/2010  . DIVERTICULITIS-COLON 05/17/2010  . CONSTIPATION 05/17/2010  . ABDOMINAL PAIN-LLQ 02/03/2009  . DIVERTICULOSIS, COLON 02/02/2009  . COLONIC POLYPS, HYPERPLASTIC, HX OF 02/02/2009  . GERD 08/09/2008  . IRRITABLE BOWEL SYNDROME 08/09/2008  . DYSPHAGIA UNSPECIFIED 08/09/2008  . DIARRHEA 08/09/2008  . CHANGE IN BOWELS 08/09/2008  . ABDOMINAL PAIN-MULTIPLE SITES 08/09/2008   Outpatient Encounter Prescriptions as of 01/20/2015  Medication Sig  . acetaminophen (TYLENOL) 500 MG tablet Take 500 mg by mouth every 6 (six) hours as needed.  Marland Kitchen azithromycin (ZITHROMAX) 250 MG tablet 2 pills the first day then one daily for infection until completed  . esomeprazole (NEXIUM) 40 MG capsule Take 1 capsule (40 mg total) by mouth 2 (two) times daily.  Marland Kitchen glucosamine-chondroitin 500-400 MG tablet Take 1 tablet by mouth daily.  Marland Kitchen glycopyrrolate (ROBINUL) 2 MG tablet Take 1 tablet (2 mg total) by  mouth daily as needed (for stomach cramping).  . hyoscyamine (LEVSIN/SL) 0.125 MG SL tablet Place 1 tablet (0.125 mg total) under the tongue every 4 (four) hours as needed for cramping.  . mometasone (NASONEX) 50 MCG/ACT nasal spray Place 2 sprays into the nose daily.  . Multiple Vitamin (MULTIVITAMIN WITH MINERALS) TABS tablet Take 1 tablet by mouth daily.  . Naproxen Sodium (ALEVE) 220 MG CAPS Take by mouth.  . Omega-3 Fatty Acids (FISH OIL PO) Take 2 capsules by mouth daily.  Marland Kitchen VITAMIN D, ERGOCALCIFEROL, PO Take 1 tablet by mouth daily. 2 x per week  . traMADol (ULTRAM) 50 MG tablet Take 2 tablets (100 mg total) by mouth every 6 (six) hours as needed. (Patient not taking: Reported on 01/20/2015)   No facility-administered encounter medications on file as of 01/20/2015.       Objective:   Physical Exam  Constitutional: She is oriented to person, place, and time. She appears well-developed and well-nourished. She appears distressed.  HENT:  Head: Normocephalic and atraumatic.  Right Ear: External ear normal.  Left Ear: External ear normal.  Mouth/Throat: Oropharynx is clear and moist. No oropharyngeal exudate.  Frontal sinus tenderness Nasal congestion bilaterally  Eyes: Conjunctivae and EOM are normal. Pupils are equal, round, and reactive to light. Right eye exhibits no discharge. Left eye exhibits no discharge. No scleral icterus.  Neck: Normal range of motion. Neck supple. No thyromegaly present.  Anterior cervical tenderness  Cardiovascular: Normal rate, regular rhythm and normal heart sounds.   No murmur  heard. Pulmonary/Chest: Effort normal and breath sounds normal. No respiratory distress. She has no wheezes. She has no rales. She exhibits no tenderness.  Dry cough  Musculoskeletal: Normal range of motion. She exhibits no edema.  Lymphadenopathy:    She has no cervical adenopathy.  Neurological: She is alert and oriented to person, place, and time. No cranial nerve deficit.    Skin: Skin is warm and dry. No rash noted.  Psychiatric: She has a normal mood and affect. Her behavior is normal. Judgment and thought content normal.  Nursing note and vitals reviewed.   BP 142/71 mmHg  Pulse 73  Temp(Src) 97 F (36.1 C) (Oral)  Ht 5\' 6"  (1.676 m)  Wt 211 lb 6.4 oz (95.89 kg)  BMI 34.14 kg/m2       Assessment & Plan:  1. Cough -Continue with Mucinex, Tylenol for aches pains and fever and drinking plenty of fluids - DG Chest 2 View; Future - CBC with Differential/Platelet  2. Other fatigue - Anemia Profile B - Thyroid Panel With TSH - Vit D  25 hydroxy (rtn osteoporosis monitoring)  3. Headache around the eyes -Continue with nasal saline  4. Acute bronchitis, unspecified organism -The Zithromax that you just completed is still working for 10 days give this some more time to work drink of fluids and take the Mucinex and use the nasal saline  5. Viral syndrome -Rest and fluids  6. Vitamin D deficiency -The patient says she is currently taking vitamin D 50,000 units twice weekly and we will check her vitamin D level today.  Patient Instructions  Continue to drink plenty of fluids and take Mucinex, use nasal saline, gargle with warm salty water and rest We will call the CBC results, the thyroid profile, and the vitamin D results as soon as it becomes available We will also call with the official reading by the radiologist of the chest x-ray report    Arrie Senate MD

## 2015-01-21 LAB — CBC WITH DIFFERENTIAL/PLATELET
BASOS ABS: 0.1 10*3/uL (ref 0.0–0.2)
Basos: 1 %
EOS (ABSOLUTE): 0.2 10*3/uL (ref 0.0–0.4)
Eos: 2 %
Hematocrit: 42.9 % (ref 34.0–46.6)
Hemoglobin: 14.3 g/dL (ref 11.1–15.9)
IMMATURE GRANULOCYTES: 1 %
Immature Grans (Abs): 0 10*3/uL (ref 0.0–0.1)
Lymphocytes Absolute: 3.3 10*3/uL — ABNORMAL HIGH (ref 0.7–3.1)
Lymphs: 39 %
MCH: 28.7 pg (ref 26.6–33.0)
MCHC: 33.3 g/dL (ref 31.5–35.7)
MCV: 86 fL (ref 79–97)
MONOS ABS: 0.6 10*3/uL (ref 0.1–0.9)
Monocytes: 7 %
NEUTROS PCT: 50 %
Neutrophils Absolute: 4.2 10*3/uL (ref 1.4–7.0)
PLATELETS: 343 10*3/uL (ref 150–379)
RBC: 4.99 x10E6/uL (ref 3.77–5.28)
RDW: 13.7 % (ref 12.3–15.4)
WBC: 8.4 10*3/uL (ref 3.4–10.8)

## 2015-01-23 LAB — ANEMIA PROFILE B
FOLATE: 9.6 ng/mL (ref >3.0)
Ferritin: 59 ng/mL (ref 15–150)
IRON SATURATION: 21 % (ref 15–55)
IRON: 65 ug/dL (ref 27–139)
Total Iron Binding Capacity: 304 ug/dL (ref 250–450)
UIBC: 239 ug/dL (ref 118–369)
VITAMIN B 12: 587 pg/mL (ref 211–946)

## 2015-01-23 LAB — THYROID PANEL WITH TSH
FREE THYROXINE INDEX: 1.9 (ref 1.2–4.9)
T3 Uptake Ratio: 25 % (ref 24–39)
T4 TOTAL: 7.4 ug/dL (ref 4.5–12.0)
TSH: 1.58 u[IU]/mL (ref 0.450–4.500)

## 2015-01-23 LAB — RETICULOCYTES: RETIC CT PCT: 1.3 % (ref 0.6–2.6)

## 2015-01-23 LAB — SPECIMEN STATUS REPORT

## 2015-01-23 LAB — VITAMIN D 25 HYDROXY (VIT D DEFICIENCY, FRACTURES): VIT D 25 HYDROXY: 37 ng/mL (ref 30.0–100.0)

## 2015-01-24 MED ORDER — LEVOFLOXACIN 500 MG PO TABS: 500.0000 mg | ORAL_TABLET | Freq: Every day | ORAL | Status: DC

## 2015-01-24 NOTE — Addendum Note (Signed)
Addended by: Thana Ates on: 01/24/2015 04:41 PM   Modules accepted: Orders

## 2015-01-25 ENCOUNTER — Ambulatory Visit: Payer: BC Managed Care – PPO | Admitting: Family Medicine

## 2015-02-01 ENCOUNTER — Ambulatory Visit: Payer: Self-pay | Admitting: Family Medicine

## 2015-02-02 ENCOUNTER — Encounter: Payer: Self-pay | Admitting: Family Medicine

## 2015-02-27 ENCOUNTER — Telehealth: Payer: Self-pay | Admitting: Internal Medicine

## 2015-02-27 ENCOUNTER — Encounter: Payer: Self-pay | Admitting: Internal Medicine

## 2015-02-27 ENCOUNTER — Other Ambulatory Visit: Payer: Self-pay | Admitting: Internal Medicine

## 2015-02-27 MED ORDER — HYOSCYAMINE SULFATE 0.125 MG SL SUBL: 0.1250 mg | SUBLINGUAL_TABLET | SUBLINGUAL | Status: DC | PRN

## 2015-02-27 NOTE — Telephone Encounter (Signed)
Sounds like viral gastroenteritis Levsin refilled She is advised to see PCP or call us back if not better

## 2015-02-27 NOTE — Telephone Encounter (Signed)
Previous Jasmine Stephens pt states she is having abdominal pain, pressure all across her abdomen below her belly button. States she has a fever, doesn't know what temp is, chills and a bad headache. Pt not sure if she is having a diverticulitis flare or just a virus. Pt is requesting a refill on her levsin. Dr. Carlean Purl as doc of the day please advise.

## 2015-03-01 ENCOUNTER — Ambulatory Visit: Payer: BC Managed Care – PPO

## 2015-03-02 NOTE — Telephone Encounter (Signed)
This encounter was created in error - please disregard.

## 2015-05-10 ENCOUNTER — Telehealth: Payer: Self-pay | Admitting: Family Medicine

## 2015-05-10 ENCOUNTER — Ambulatory Visit (INDEPENDENT_AMBULATORY_CARE_PROVIDER_SITE_OTHER): Payer: BC Managed Care – PPO | Admitting: Family Medicine

## 2015-05-10 ENCOUNTER — Encounter: Payer: Self-pay | Admitting: *Deleted

## 2015-05-10 ENCOUNTER — Encounter: Payer: Self-pay | Admitting: Family Medicine

## 2015-05-10 VITALS — BP 122/78 | HR 77 | Temp 97.0°F | Ht 66.0 in | Wt 211.0 lb

## 2015-05-10 DIAGNOSIS — J069 Acute upper respiratory infection, unspecified: Secondary | ICD-10-CM | POA: Diagnosis not present

## 2015-05-10 DIAGNOSIS — R6889 Other general symptoms and signs: Secondary | ICD-10-CM

## 2015-05-10 DIAGNOSIS — B349 Viral infection, unspecified: Secondary | ICD-10-CM | POA: Diagnosis not present

## 2015-05-10 LAB — POCT INFLUENZA A/B
INFLUENZA A, POC: NEGATIVE
Influenza B, POC: NEGATIVE

## 2015-05-10 MED ORDER — OSELTAMIVIR PHOSPHATE 75 MG PO CAPS: 75.0000 mg | ORAL_CAPSULE | Freq: Two times a day (BID) | ORAL | Status: DC

## 2015-05-10 MED ORDER — AZITHROMYCIN 250 MG PO TABS: ORAL_TABLET | ORAL | Status: DC

## 2015-05-10 NOTE — Telephone Encounter (Signed)
I normally prescribe Mucinex maximum strength plain, blue and white in color, 1 twice daily with a large glass of water. That was on the sheet that I gave her and she left the office.

## 2015-05-10 NOTE — Patient Instructions (Signed)
Drink plenty of fluids and stay well hydrated Use Mucinex maximum strength 1 twice daily with a large glass of water Take flu medicine as directed twice daily until completed and take Z-Pak because of the URI Take Tylenol for aches pains and fever When better, try to wean off of the proton pump inhibitor and start Zantac or ranitidine 150 twice daily before breakfast and supper

## 2015-05-10 NOTE — Progress Notes (Signed)
Subjective:    Patient ID: Jasmine Stephens, female    DOB: 08-21-1950, 65 y.o.   MRN: [id number]  HPI Patient here today for flu like symptoms that started on Monday. The patient complains of fatigue head congestion and ear pain and nausea and myalgias and headache. This started almost 2 days ago.     Patient Active Problem List   Diagnosis Date Noted  . Family history of malignant neoplasm of gastrointestinal tract 05/31/2013  . Stricture and stenosis of esophagus 05/31/2013  . ANXIETY 05/17/2010  . DIVERTICULITIS-COLON 05/17/2010  . CONSTIPATION 05/17/2010  . ABDOMINAL PAIN-LLQ 02/03/2009  . DIVERTICULOSIS, COLON 02/02/2009  . COLONIC POLYPS, HYPERPLASTIC, HX OF 02/02/2009  . GERD 08/09/2008  . IRRITABLE BOWEL SYNDROME 08/09/2008  . DYSPHAGIA UNSPECIFIED 08/09/2008  . DIARRHEA 08/09/2008  . CHANGE IN BOWELS 08/09/2008  . ABDOMINAL PAIN-MULTIPLE SITES 08/09/2008   Outpatient Encounter Prescriptions as of 05/10/2015  Medication Sig  . acetaminophen (TYLENOL) 500 MG tablet Take 500 mg by mouth every 6 (six) hours as needed.  Marland Kitchen glucosamine-chondroitin 500-400 MG tablet Take 1 tablet by mouth daily.  . hyoscyamine (LEVSIN/SL) 0.125 MG SL tablet Place 1 tablet (0.125 mg total) under the tongue every 4 (four) hours as needed for cramping.  . mometasone (NASONEX) 50 MCG/ACT nasal spray Place 2 sprays into the nose daily.  . Multiple Vitamin (MULTIVITAMIN WITH MINERALS) TABS tablet Take 1 tablet by mouth daily.  . Omega-3 Fatty Acids (FISH OIL PO) Take 2 capsules by mouth daily.  Marland Kitchen VITAMIN D, ERGOCALCIFEROL, PO Take 1 tablet by mouth daily. 2 x per week  . [DISCONTINUED] azithromycin (ZITHROMAX) 250 MG tablet 2 pills the first day then one daily for infection until completed  . [DISCONTINUED] esomeprazole (NEXIUM) 40 MG capsule Take 1 capsule (40 mg total) by mouth 2 (two) times daily.  . [DISCONTINUED] levofloxacin (LEVAQUIN) 500 MG tablet Take 1 tablet (500 mg total) by mouth daily.  .  [DISCONTINUED] Naproxen Sodium (ALEVE) 220 MG CAPS Take by mouth.  . [DISCONTINUED] traMADol (ULTRAM) 50 MG tablet Take 2 tablets (100 mg total) by mouth every 6 (six) hours as needed.   No facility-administered encounter medications on file as of 05/10/2015.      Review of Systems  Constitutional: Positive for fatigue.  HENT: Positive for congestion and ear pain.   Eyes: Negative.   Respiratory: Positive for cough.   Cardiovascular: Negative.   Gastrointestinal: Positive for nausea.  Endocrine: Negative.   Genitourinary: Negative.   Musculoskeletal: Positive for myalgias.  Skin: Negative.   Allergic/Immunologic: Negative.   Neurological: Positive for headaches.  Hematological: Negative.   Psychiatric/Behavioral: Negative.        Objective:   Physical Exam  Constitutional: She is oriented to person, place, and time. She appears well-developed and well-nourished. She appears distressed.  HENT:  Head: Normocephalic and atraumatic.  Right Ear: External ear normal.  Left Ear: External ear normal.  Mouth/Throat: Oropharynx is clear and moist.  TMs appear normal. Nasal congestion bilaterally  Eyes: Conjunctivae and EOM are normal. Pupils are equal, round, and reactive to light. Right eye exhibits no discharge. Left eye exhibits no discharge. No scleral icterus.  Neck: Normal range of motion. Neck supple. No thyromegaly present.  Cardiovascular: Normal rate, regular rhythm and normal heart sounds.   No murmur heard. Pulmonary/Chest: Effort normal and breath sounds normal. No respiratory distress. She has no wheezes. She has no rales. She exhibits no tenderness.  Dry cough  Abdominal: Soft. Bowel sounds are  normal. She exhibits no mass. There is no tenderness. There is no rebound and no guarding.  Musculoskeletal: Normal range of motion. She exhibits no edema.  Lymphadenopathy:    She has no cervical adenopathy.  Neurological: She is alert and oriented to person, place, and time.    Skin: Skin is warm and dry. No rash noted.  Psychiatric: She has a normal mood and affect. Her behavior is normal. Judgment and thought content normal.  Nursing note and vitals reviewed.   BP 122/78 mmHg  Pulse 77  Temp(Src) 97 F (36.1 C) (Oral)  Ht 5\' 6"  (1.676 m)  Wt 211 lb (95.709 kg)  BMI 34.07 kg/m2 Results for orders placed or performed in visit on 05/10/15  POCT Influenza A/B  Result Value Ref Range   Influenza A, POC Negative Negative   Influenza B, POC Negative Negative        Assessment & Plan:  1. Flu-like symptoms -Take Tylenol for aches pains and fever - POCT Influenza A/B - oseltamivir (TAMIFLU) 75 MG capsule; Take 1 capsule (75 mg total) by mouth 2 (two) times daily.  Dispense: 10 capsule; Refill: 0  2. Viral syndrome -Rest and plenty of fluids - oseltamivir (TAMIFLU) 75 MG capsule; Take 1 capsule (75 mg total) by mouth 2 (two) times daily.  Dispense: 10 capsule; Refill: 0  3. URI, acute -Use Mucinex for cough and congestion and nasal saline for head congestion - azithromycin (ZITHROMAX) 250 MG tablet; 2 pills the first day then one daily for infection until completed  Dispense: 6 tablet; Refill: 0  Patient Instructions  Drink plenty of fluids and stay well hydrated Use Mucinex maximum strength 1 twice daily with a large glass of water Take flu medicine as directed twice daily until completed and take Z-Pak because of the URI Take Tylenol for aches pains and fever When better, try to wean off of the proton pump inhibitor and start Zantac or ranitidine 150 twice daily before breakfast and supper   Arrie Senate MD

## 2015-05-10 NOTE — Telephone Encounter (Signed)
Patient aware and verbalizes understanding. 

## 2015-09-25 ENCOUNTER — Other Ambulatory Visit: Payer: Self-pay | Admitting: Family Medicine

## 2015-09-25 DIAGNOSIS — Z1231 Encounter for screening mammogram for malignant neoplasm of breast: Secondary | ICD-10-CM

## 2015-09-27 IMAGING — CR DG LUMBAR SPINE COMPLETE 4+V
5 series · 5 of 5 positions shown · non-contrast
Comparison: None.

CLINICAL DATA: Low back pain following fall down stairs, initial
encounter

EXAM:
LUMBAR SPINE - COMPLETE 4+ VIEW

[view not recorded (1 of 5)]
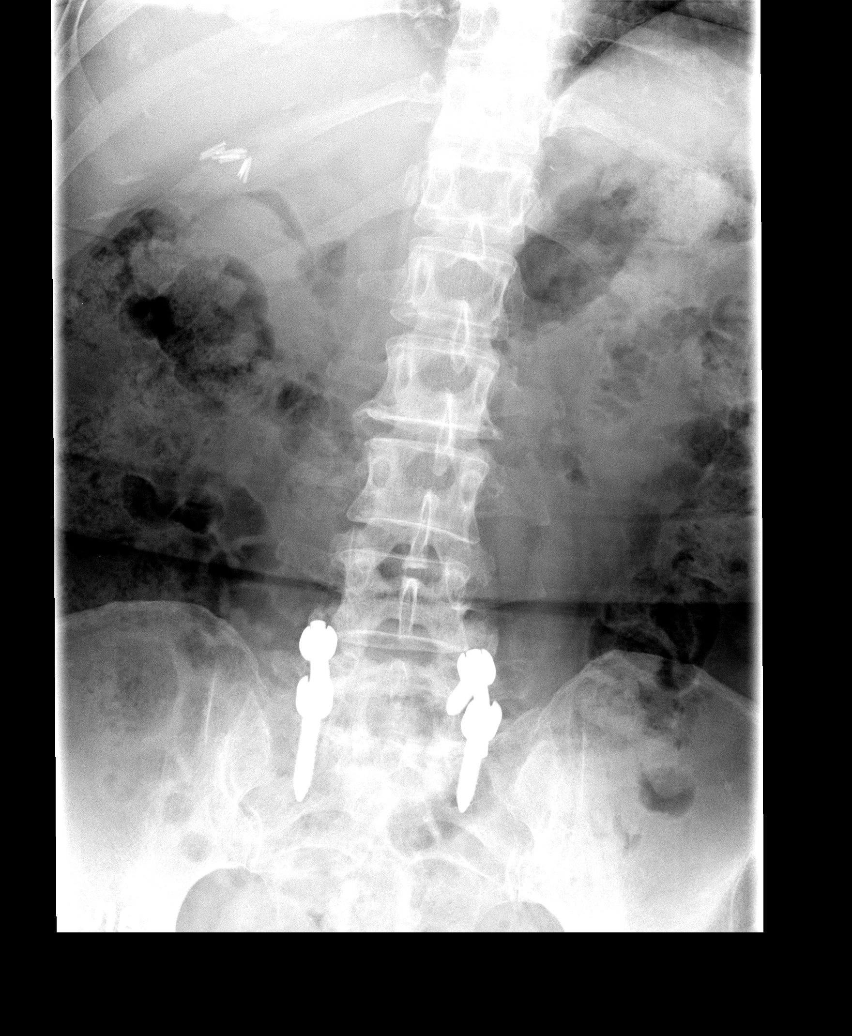

[view not recorded (2 of 5)]
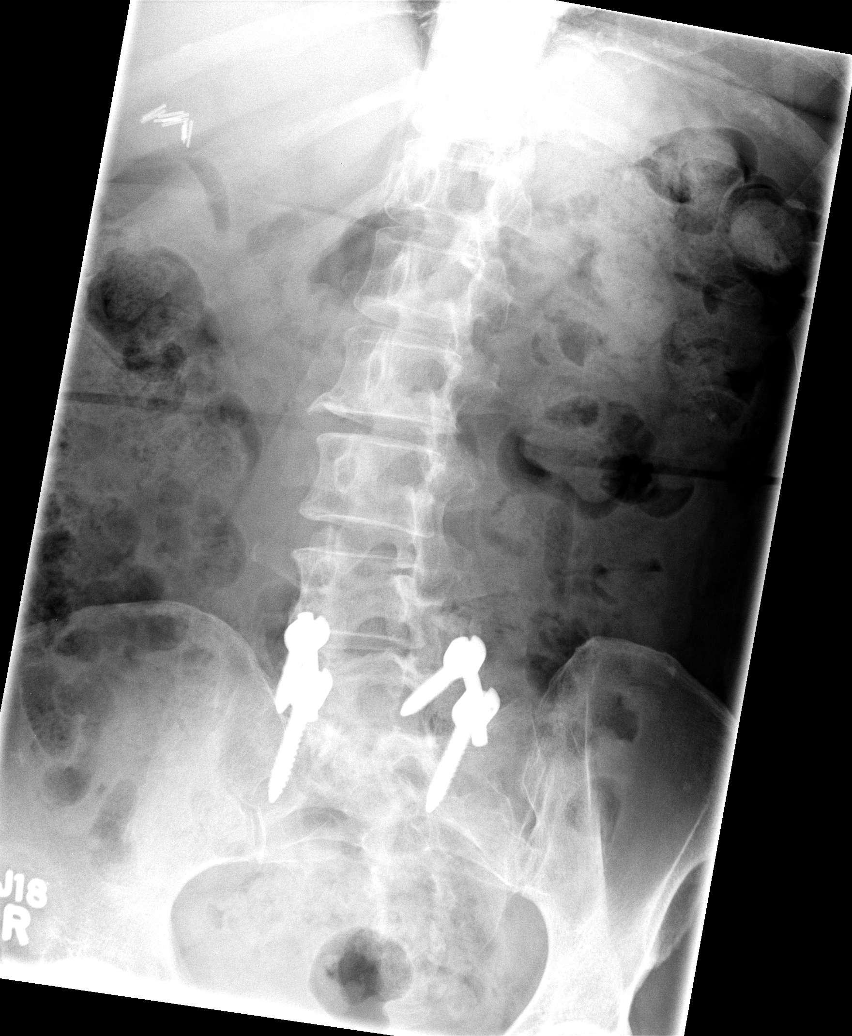

[view not recorded (3 of 5)]
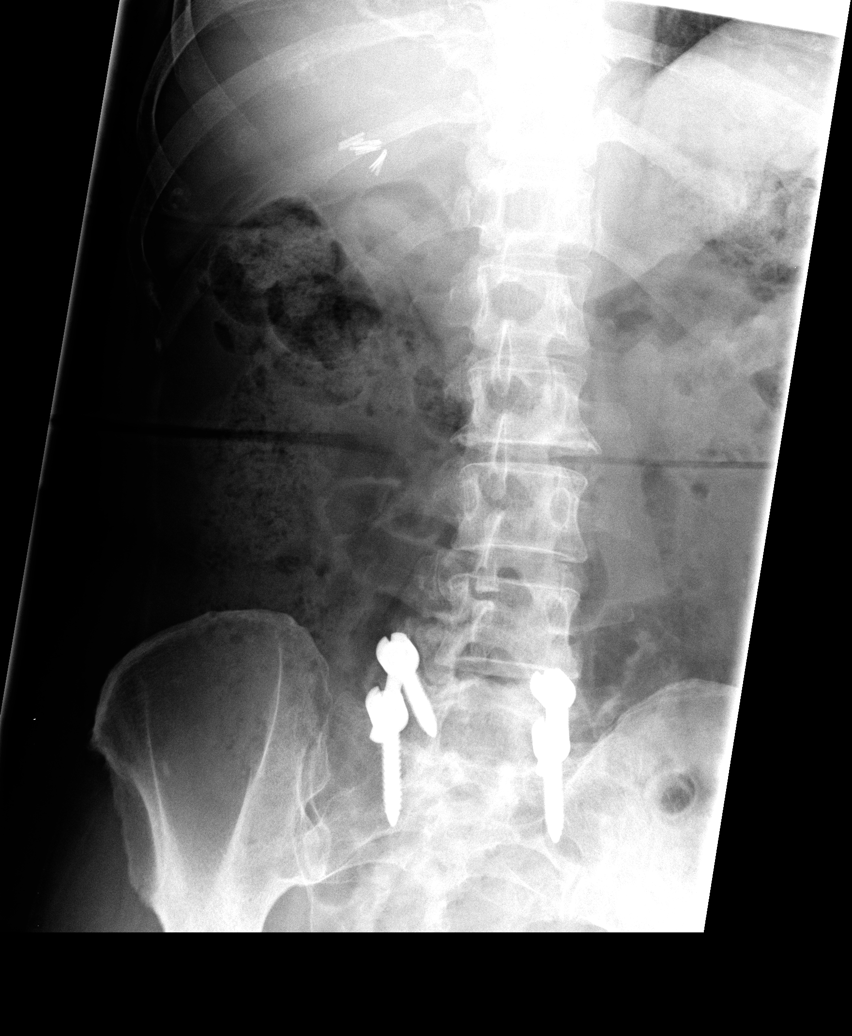

[view not recorded (4 of 5)]
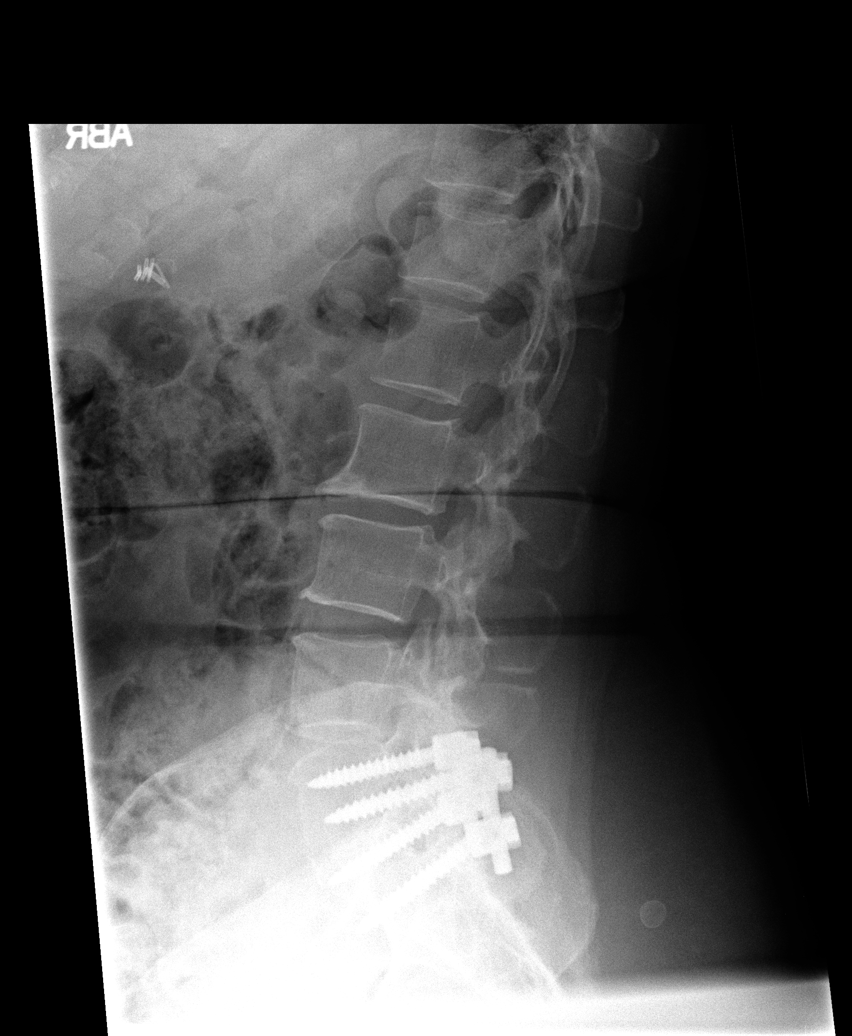

[view not recorded (5 of 5)]
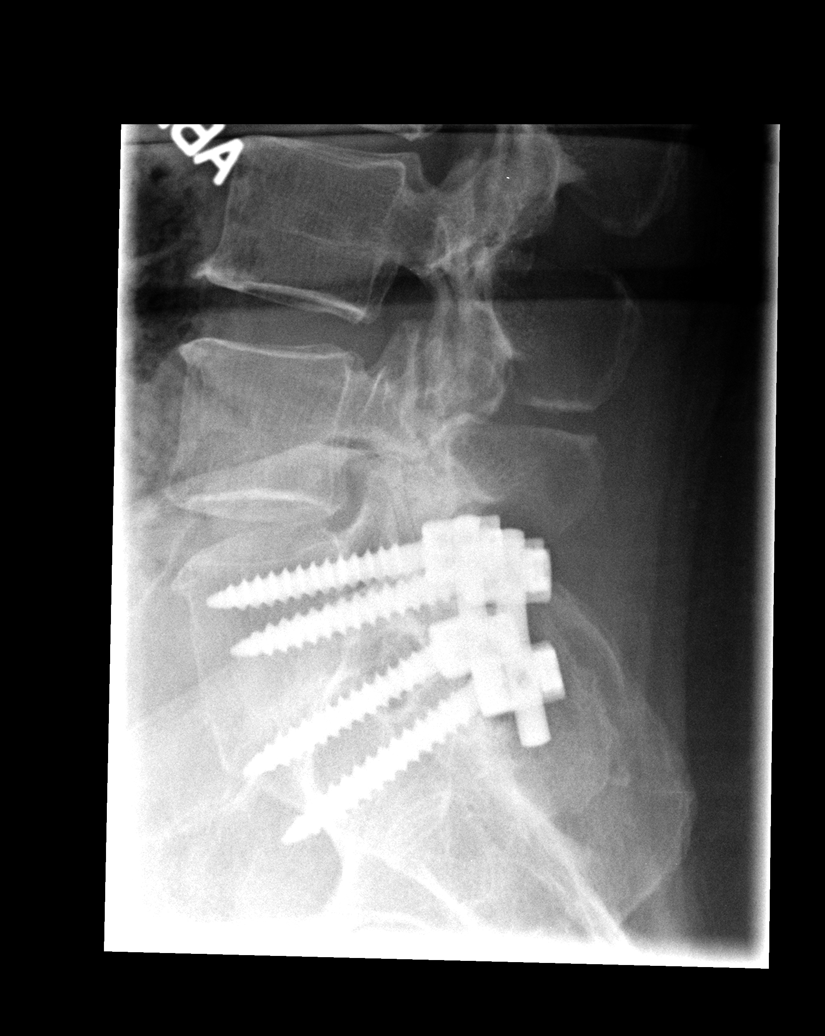

[5 of 5 positions shown; findings below may reference images not displayed]

FINDINGS: Five lumbar type vertebral bodies are well visualized. Postsurgical
changes are noted at L5-S1. Mild osteophytic changes are seen no
spondylolysis or spondylolisthesis is seen. No definitive
compression deformity is noted. No hardware failure is seen.
IMPRESSION: Mild degenerative and postoperative changes. No acute abnormality is
noted.

## 2015-09-27 IMAGING — CR DG CERVICAL SPINE COMPLETE 4+V
7 series · 7 of 7 positions shown · non-contrast
Comparison: None.

CLINICAL DATA: Neck pain.  Patient fell down steps recently

EXAM:
CERVICAL SPINE  4+ VIEWS

[view not recorded (1 of 7)]
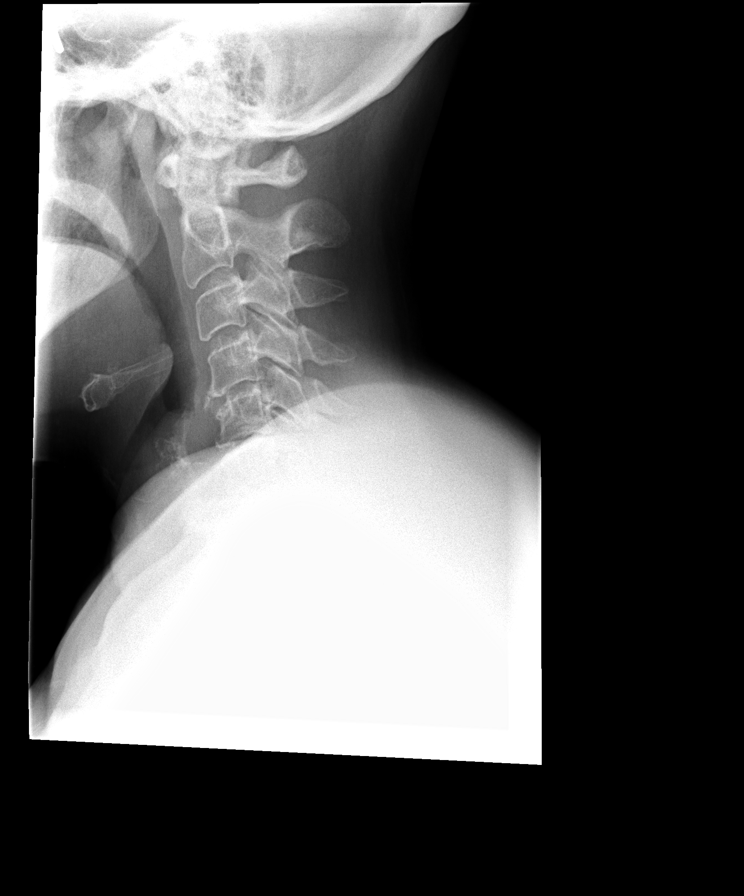

[view not recorded (2 of 7)]
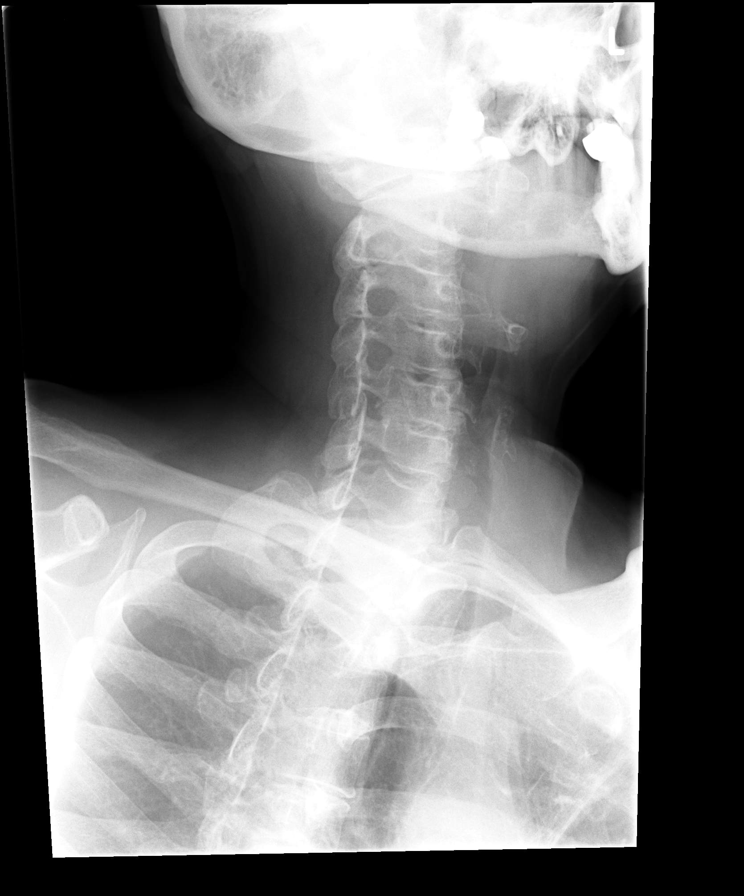

[view not recorded (3 of 7)]
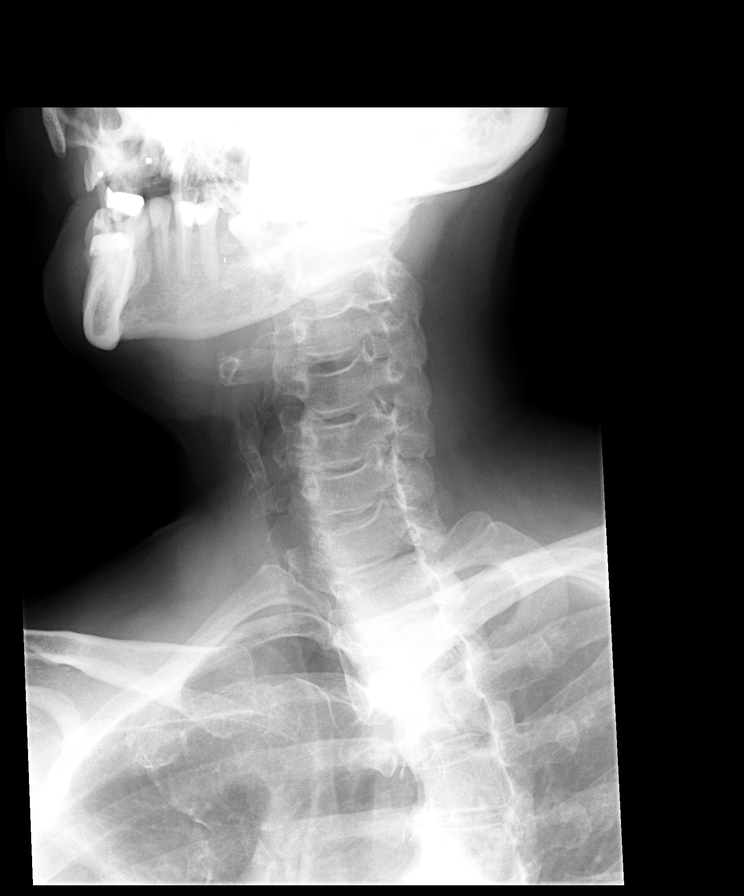

[view not recorded (4 of 7)]
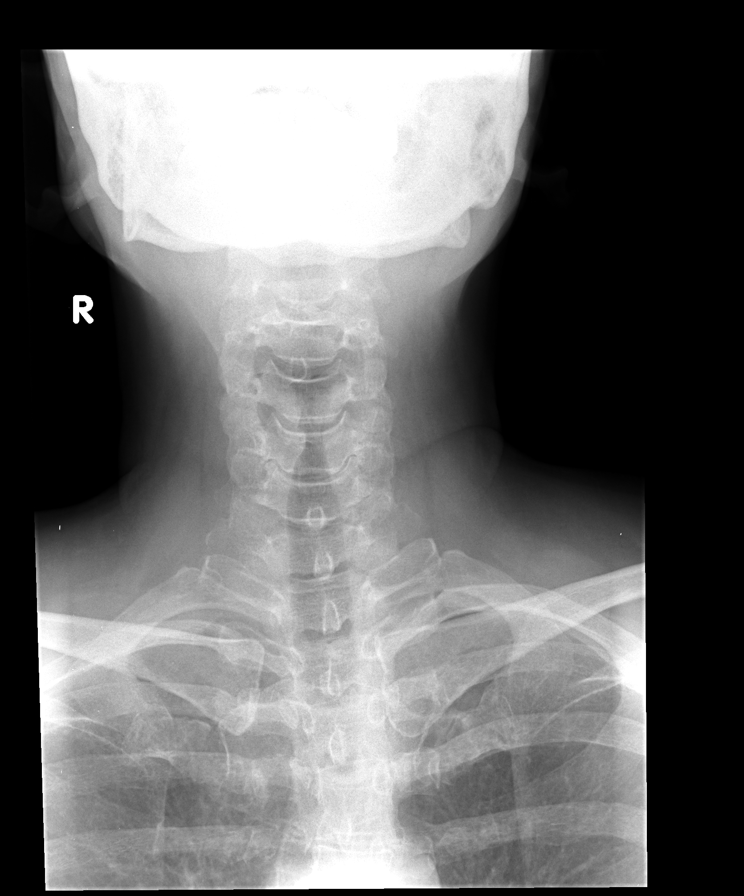

[view not recorded (5 of 7)]
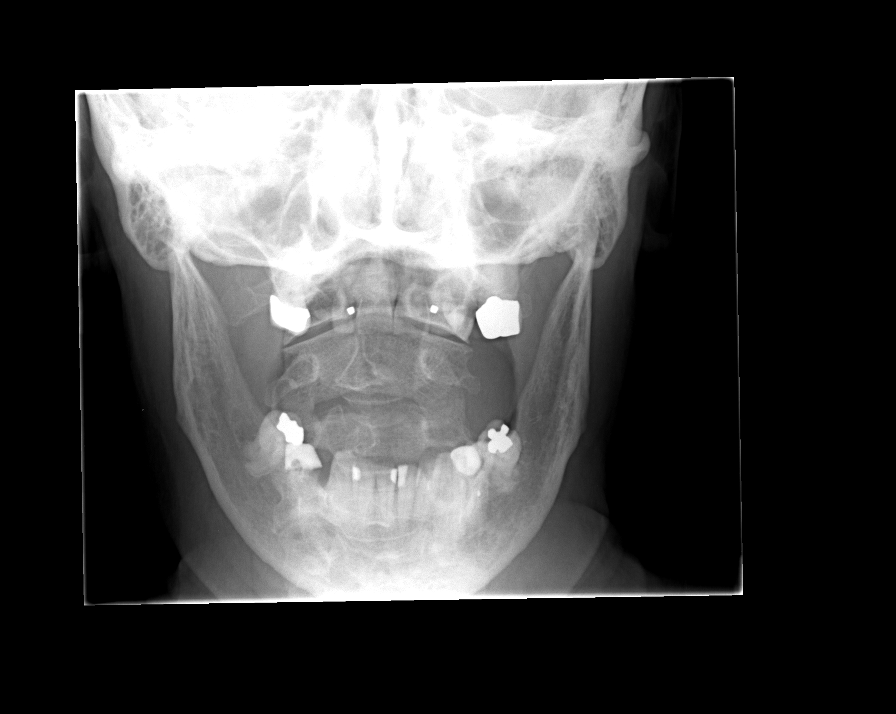

[view not recorded (6 of 7)]
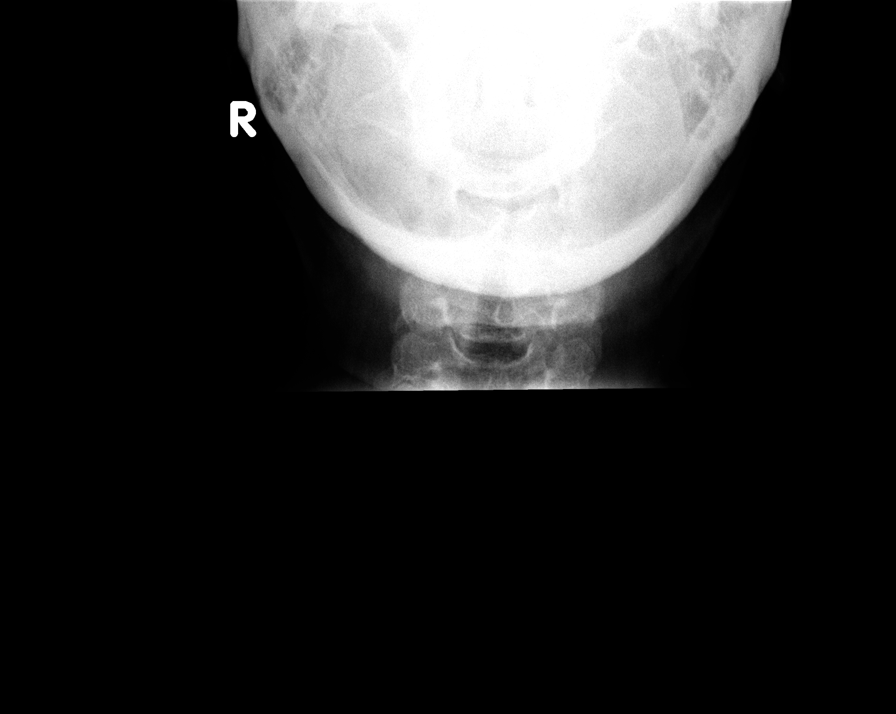

[view not recorded (7 of 7)]
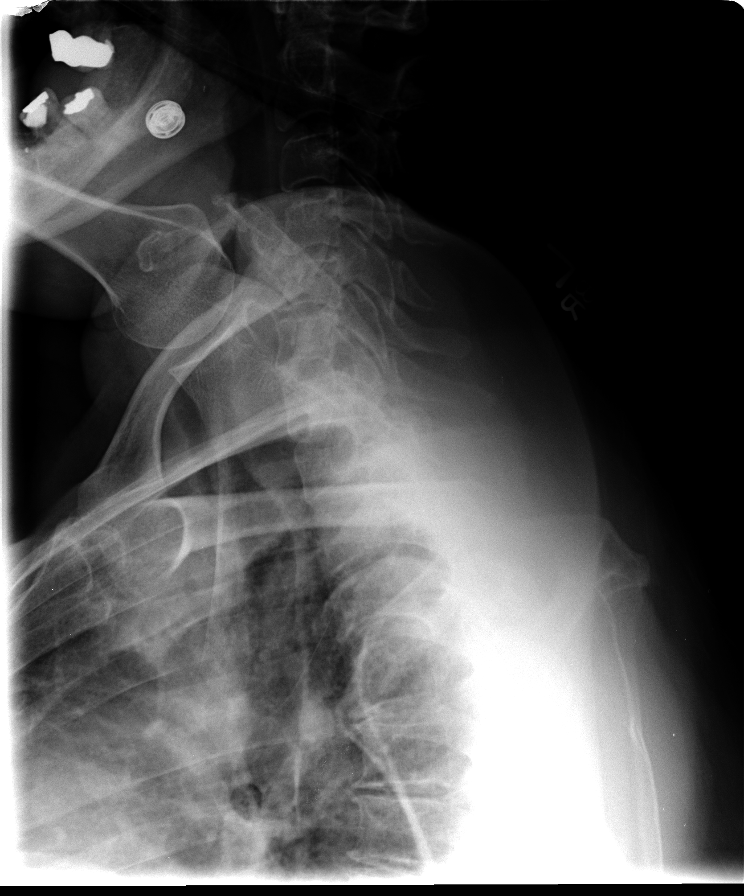

[7 of 7 positions shown; findings below may reference images not displayed]

FINDINGS: Frontal, lateral, open-mouth odontoid, and bilateral oblique views
were obtained. There is no fracture or spondylolisthesis.
Prevertebral soft tissues and predental space regions are normal.
There is moderate disc space narrowing at C4-5, C5-6, and C6-7.
There is exit foraminal narrowing due to facet osteoarthritic change
at C5-6 bilaterally.
IMPRESSION: Osteoarthritic changes several levels, most notably at C5-6. No
fracture or spondylolisthesis.

## 2015-10-03 ENCOUNTER — Ambulatory Visit: Payer: BC Managed Care – PPO

## 2015-10-09 ENCOUNTER — Ambulatory Visit
Admission: RE | Admit: 2015-10-09 | Discharge: 2015-10-09 | Disposition: A | Discharge status: Home / Self Care | Payer: BC Managed Care – PPO | Source: Ambulatory Visit | Attending: Family Medicine | Admitting: Family Medicine

## 2015-10-09 DIAGNOSIS — Z1231 Encounter for screening mammogram for malignant neoplasm of breast: Secondary | ICD-10-CM

## 2015-10-11 ENCOUNTER — Ambulatory Visit: Payer: BC Managed Care – PPO

## 2016-02-23 ENCOUNTER — Encounter (INDEPENDENT_AMBULATORY_CARE_PROVIDER_SITE_OTHER): Payer: Self-pay

## 2016-02-23 ENCOUNTER — Ambulatory Visit (INDEPENDENT_AMBULATORY_CARE_PROVIDER_SITE_OTHER): Payer: BC Managed Care – PPO | Admitting: Family Medicine

## 2016-02-23 ENCOUNTER — Encounter: Payer: Self-pay | Admitting: Family Medicine

## 2016-02-23 VITALS — BP 138/77 | HR 78 | Temp 97.2°F | Ht 66.0 in | Wt 200.0 lb

## 2016-02-23 DIAGNOSIS — J0100 Acute maxillary sinusitis, unspecified: Secondary | ICD-10-CM

## 2016-02-23 MED ORDER — DOXYCYCLINE HYCLATE 100 MG PO TABS: 100.0000 mg | ORAL_TABLET | Freq: Two times a day (BID) | ORAL | 0 refills | Status: DC

## 2016-02-23 MED ORDER — TRIAMCINOLONE ACETONIDE 40 MG/ML IJ SUSP
40.0000 mg | Freq: Once | INTRAMUSCULAR | Status: AC
  Administered 2016-02-23: 40 mg via INTRAMUSCULAR

## 2016-02-23 NOTE — Progress Notes (Signed)
   HPI  Patient presents today with sinus pressure and cough.  Patient complains of cough for one month. She states that it has waxed and waned over the last 4-5 days has been persistent again.  2 weeks ago she took a course of Mucinex sinus and had large sinus drainage and improvement of the cough, returned after 3-4 days.  Patient complains of sinus pressure for 1 week or so. She states it's bilateral sinus pressure in the maxillary and frontal sinuses.  She denies any chest pain, or difficulty tolerating foods or fluids.  She does have mild dyspnea.  PMH: Smoking status noted ROS: Per HPI  Objective: BP 138/77   Pulse 78   Temp 97.2 F (36.2 C) (Oral)   Ht 5\' 6"  (1.676 m)   Wt 200 lb (90.7 kg)   BMI 32.28 kg/m  Gen: NAD, alert, cooperative with exam HEENT: NCAT, severe tenderness to palpation of the left-sided maxillary sinus, nares with swollen turbinates on the left, TMs normal bilaterally with slight erythema on the left, oropharynx moist CV: RRR, good S1/S2, no murmur Resp: Nonlabored, coarse breath sounds scattered Ext: No edema, warm Neuro: Alert and oriented, No gross deficits  Assessment and plan:  # Acuet non-recurrent sinusitis Treatment doxycycline, penicillin allergic With persistent cough and coarse breath sounds today and given her IM Kenalog. Supportive care discussed Return to clinic with any failure to improve or worsening symptoms.    Laroy Apple, MD [facility] 02/23/2016, 4:37 PM

## 2016-02-23 NOTE — Addendum Note (Signed)
Addended by: Karle Plumber on: 02/23/2016 05:24 PM   Modules accepted: Orders

## 2016-02-23 NOTE — Patient Instructions (Signed)
Great to meet you!   Sinusitis, Adult Sinusitis is soreness and inflammation of your sinuses. Sinuses are hollow spaces in the bones around your face. They are located:  Around your eyes.  In the middle of your forehead.  Behind your nose.  In your cheekbones. Your sinuses and nasal passages are lined with a stringy fluid (mucus). Mucus normally drains out of your sinuses. When your nasal tissues get inflamed or swollen, the mucus can get trapped or blocked so air cannot flow through your sinuses. This lets bacteria, viruses, and funguses grow, and that leads to infection. Follow these instructions at home: Medicines  Take, use, or apply over-the-counter and prescription medicines only as told by your doctor. These may include nasal sprays.  If you were prescribed an antibiotic medicine, take it as told by your doctor. Do not stop taking the antibiotic even if you start to feel better. Hydrate and Humidify  Drink enough water to keep your pee (urine) clear or pale yellow.  Use a cool mist humidifier to keep the humidity level in your home above 50%.  Breathe in steam for 10-15 minutes, 3-4 times a day or as told by your doctor. You can do this in the bathroom while a hot shower is running.  Try not to spend time in cool or dry air. Rest  Rest as much as possible.  Sleep with your head raised (elevated).  Make sure to get enough sleep each night. General instructions  Put a warm, moist washcloth on your face 3-4 times a day or as told by your doctor. This will help with discomfort.  Wash your hands often with soap and water. If there is no soap and water, use hand sanitizer.  Do not smoke. Avoid being around people who are smoking (secondhand smoke).  Keep all follow-up visits as told by your doctor. This is important. Contact a doctor if:  You have a fever.  Your symptoms get worse.  Your symptoms do not get better within 10 days. Get help right away if:  You  have a very bad headache.  You cannot stop throwing up (vomiting).  You have pain or swelling around your face or eyes.  You have trouble seeing.  You feel confused.  Your neck is stiff.  You have trouble breathing. This information is not intended to replace advice given to you by your health care provider. Make sure you discuss any questions you have with your health care provider. Document Released: 08/28/2007 Document Revised: 11/05/2015 Document Reviewed: 01/04/2015 Elsevier Interactive Patient Education  2017 [employer].

## 2016-08-05 ENCOUNTER — Telehealth: Payer: Self-pay | Admitting: Gastroenterology

## 2016-08-05 MED ORDER — HYOSCYAMINE SULFATE 0.125 MG SL SUBL: 0.1250 mg | SUBLINGUAL_TABLET | SUBLINGUAL | 0 refills | Status: DC | PRN

## 2016-08-05 NOTE — Telephone Encounter (Signed)
Ok to refill until appt with PG. Supervising MDs for PG next week will assume her care.

## 2016-08-05 NOTE — Telephone Encounter (Signed)
rx sent. Patient notified via voicemail

## 2016-08-05 NOTE — Telephone Encounter (Signed)
Dr. Fuller Plan are you ok with sending in a refill of MD of the day for a former [facility] patient until she can be seen next week by Tye Savoy RNP ?

## 2016-08-06 ENCOUNTER — Encounter: Payer: Self-pay | Admitting: Family

## 2016-08-06 ENCOUNTER — Ambulatory Visit (INDEPENDENT_AMBULATORY_CARE_PROVIDER_SITE_OTHER): Payer: BC Managed Care – PPO | Admitting: Family

## 2016-08-06 VITALS — BP 132/85 | HR 77 | Temp 98.0°F | Ht 66.0 in | Wt 216.6 lb

## 2016-08-06 DIAGNOSIS — R05 Cough: Secondary | ICD-10-CM | POA: Diagnosis not present

## 2016-08-06 DIAGNOSIS — R059 Cough, unspecified: Secondary | ICD-10-CM

## 2016-08-06 DIAGNOSIS — J0101 Acute recurrent maxillary sinusitis: Secondary | ICD-10-CM

## 2016-08-06 MED ORDER — HYDROCODONE-HOMATROPINE 5-1.5 MG/5ML PO SYRP: 5.0000 mL | ORAL_SOLUTION | Freq: Three times a day (TID) | ORAL | 0 refills | Status: DC | PRN

## 2016-08-06 MED ORDER — DOXYCYCLINE HYCLATE 100 MG PO TABS: 100.0000 mg | ORAL_TABLET | Freq: Two times a day (BID) | ORAL | 0 refills | Status: DC

## 2016-08-06 MED ORDER — BENZONATATE 200 MG PO CAPS: 200.0000 mg | ORAL_CAPSULE | Freq: Three times a day (TID) | ORAL | 1 refills | Status: DC | PRN

## 2016-08-06 NOTE — Patient Instructions (Signed)

## 2016-08-06 NOTE — Progress Notes (Signed)
Subjective:    Patient ID: Jasmine Stephens, female    DOB: [birth year], 66 y.o.   MRN: [id number]  Headache   Associated symptoms include coughing, ear pain, sinus pressure and a sore throat. Pertinent negatives include no swollen glands.  Sinus Problem  This is a recurrent problem. The current episode started 1 to 4 weeks ago. The problem has been waxing and waning since onset. There has been no fever. Associated symptoms include chills, congestion, coughing, diaphoresis, ear pain, headaches, a hoarse voice, sinus pressure, sneezing and a sore throat. Pertinent negatives include no swollen glands. Past treatments include lying down, oral decongestants and acetaminophen. The treatment provided mild relief.  Cough  Associated symptoms include chills, ear pain, headaches and a sore throat.      Review of Systems  Constitutional: Positive for chills and diaphoresis.  HENT: Positive for congestion, ear pain, hoarse voice, sinus pressure, sneezing and sore throat.   Respiratory: Positive for cough.   Neurological: Positive for headaches.  All other systems reviewed and are negative.      Objective:   Physical Exam  Constitutional: She is oriented to person, place, and time. She appears well-developed and well-nourished. No distress.  HENT:  Head: Normocephalic and atraumatic.  Right Ear: External ear normal.  Left Ear: There is tenderness.  Nose: Mucosal edema and rhinorrhea present. Right sinus exhibits maxillary sinus tenderness. Left sinus exhibits maxillary sinus tenderness.  Mouth/Throat: Posterior oropharyngeal erythema present.  Eyes: Pupils are equal, round, and reactive to light.  Neck: Normal range of motion. Neck supple. No thyromegaly present.  Cardiovascular: Normal rate, regular rhythm, normal heart sounds and intact distal pulses.   No murmur heard. Pulmonary/Chest: Effort normal and breath sounds normal. No respiratory distress. She has no wheezes.  Dry nonproductive  cough   Abdominal: Soft. Bowel sounds are normal. She exhibits no distension. There is no tenderness.  Musculoskeletal: Normal range of motion. She exhibits no edema or tenderness.  Neurological: She is alert and oriented to person, place, and time.  Skin: Skin is warm and dry.  Psychiatric: She has a normal mood and affect. Her behavior is normal. Judgment and thought content normal.  Vitals reviewed.     BP 132/85   Pulse 77   Temp 98 F (36.7 C) (Oral)   Ht 5\' 6"  (1.676 m)   Wt 216 lb 9.6 oz (98.2 kg)   BMI 34.96 kg/m      Assessment & Plan:  1. Acute recurrent maxillary sinusitis - Take meds as prescribed - Use a cool mist humidifier  -Use saline nose sprays frequently -Saline irrigations of the nose can be very helpful if done frequently.  * 4X daily for 1 week*  * Use of a nettie pot can be helpful with this. Follow directions with this* -Force fluids -For any cough or congestion  Use plain Mucinex- regular strength or max strength is fine   * Children- consult with Pharmacist for dosing -For fever or aces or pains- take tylenol or ibuprofen appropriate for age and weight.  * for fevers greater than 101 orally you may alternate ibuprofen and tylenol every  3 hours. -Throat lozenges if help -New toothbrush in 3 days - doxycycline (VIBRA-TABS) 100 MG tablet; Take 1 tablet (100 mg total) by mouth 2 (two) times daily.  Dispense: 20 tablet; Refill: 0  2. Cough - benzonatate (TESSALON) 200 MG capsule; Take 1 capsule (200 mg total) by mouth 3 (three) times daily as needed.  Dispense: 30  capsule; Refill: 1 - HYDROcodone-homatropine (HYCODAN) 5-1.5 MG/5ML syrup; Take 5 mLs by mouth every 8 (eight) hours as needed for cough.  Dispense: 120 mL; Refill: 0   Evelina Dun, FNP

## 2016-08-08 ENCOUNTER — Telehealth: Payer: Self-pay | Admitting: Family Medicine

## 2016-08-08 MED ORDER — BACLOFEN 10 MG PO TABS: 10.0000 mg | ORAL_TABLET | Freq: Three times a day (TID) | ORAL | 0 refills | Status: DC

## 2016-08-08 NOTE — Telephone Encounter (Signed)
Baclofen Prescription sent to pharmacy  ? ?

## 2016-08-08 NOTE — Telephone Encounter (Signed)
Pt is having continued muscle pain Requesting muscle relaxer Please advise

## 2016-08-08 NOTE — Telephone Encounter (Signed)
Pt notified of RX 

## 2016-08-14 ENCOUNTER — Ambulatory Visit: Payer: BC Managed Care – PPO | Admitting: Nurse Practitioner

## 2016-10-10 ENCOUNTER — Other Ambulatory Visit: Payer: Self-pay | Admitting: Otolaryngology

## 2016-10-10 DIAGNOSIS — J329 Chronic sinusitis, unspecified: Secondary | ICD-10-CM

## 2016-10-11 ENCOUNTER — Inpatient Hospital Stay
Admission: RE | Admit: 2016-10-11 | Discharge: 2016-10-11 | Disposition: A | Discharge status: Home / Self Care | Payer: BC Managed Care – PPO | Source: Ambulatory Visit | Attending: Otolaryngology | Admitting: Otolaryngology

## 2016-10-15 ENCOUNTER — Other Ambulatory Visit: Payer: BC Managed Care – PPO

## 2016-10-23 HISTORY — PX: KNEE ARTHROSCOPY: SUR90

## 2016-10-29 ENCOUNTER — Other Ambulatory Visit (HOSPITAL_COMMUNITY): Payer: Self-pay | Admitting: Orthopedic Surgery

## 2016-10-29 DIAGNOSIS — M25561 Pain in right knee: Secondary | ICD-10-CM

## 2016-11-01 ENCOUNTER — Ambulatory Visit (HOSPITAL_COMMUNITY)
Admission: RE | Admit: 2016-11-01 | Discharge: 2016-11-01 | Disposition: A | Discharge status: Home / Self Care | Payer: BC Managed Care – PPO | Source: Ambulatory Visit | Attending: Orthopedic Surgery | Admitting: Orthopedic Surgery

## 2016-11-01 DIAGNOSIS — M25561 Pain in right knee: Secondary | ICD-10-CM | POA: Insufficient documentation

## 2016-11-01 DIAGNOSIS — R609 Edema, unspecified: Secondary | ICD-10-CM | POA: Insufficient documentation

## 2016-11-01 DIAGNOSIS — M25861 Other specified joint disorders, right knee: Secondary | ICD-10-CM | POA: Diagnosis not present

## 2016-11-01 DIAGNOSIS — S83261A Peripheral tear of lateral meniscus, current injury, right knee, initial encounter: Secondary | ICD-10-CM | POA: Diagnosis not present

## 2016-11-11 ENCOUNTER — Other Ambulatory Visit: Payer: Self-pay | Admitting: Gastroenterology

## 2017-03-17 ENCOUNTER — Ambulatory Visit (HOSPITAL_COMMUNITY)
Admission: RE | Admit: 2017-03-17 | Discharge: 2017-03-17 | Disposition: A | Discharge status: Home / Self Care | Payer: BC Managed Care – PPO | Source: Ambulatory Visit | Attending: Family Medicine | Admitting: Family Medicine

## 2017-03-17 ENCOUNTER — Other Ambulatory Visit (HOSPITAL_COMMUNITY): Payer: Self-pay | Admitting: Orthopedic Surgery

## 2017-03-17 DIAGNOSIS — M79604 Pain in right leg: Secondary | ICD-10-CM

## 2017-03-17 DIAGNOSIS — M7989 Other specified soft tissue disorders: Secondary | ICD-10-CM | POA: Diagnosis not present

## 2017-03-17 NOTE — Progress Notes (Signed)
Preliminary results by tech - Right Lower Ext. Venous Duplex Completed. Negative for deep and superficial vein thrombosis. Patient was given results, the office was closed. Oda Cogan, BS, RDMS, RVT

## 2017-05-13 ENCOUNTER — Ambulatory Visit: Payer: BC Managed Care – PPO | Admitting: Nurse Practitioner

## 2017-05-13 ENCOUNTER — Encounter: Payer: Self-pay | Admitting: Nurse Practitioner

## 2017-05-13 VITALS — BP 152/76 | HR 67 | Temp 97.4°F | Ht 66.0 in

## 2017-05-13 DIAGNOSIS — J0100 Acute maxillary sinusitis, unspecified: Secondary | ICD-10-CM | POA: Diagnosis not present

## 2017-05-13 MED ORDER — LEVOFLOXACIN 500 MG PO TABS: 500.0000 mg | ORAL_TABLET | Freq: Every day | ORAL | 0 refills | Status: DC

## 2017-05-13 MED ORDER — HYDROCODONE-HOMATROPINE 5-1.5 MG/5ML PO SYRP: 5.0000 mL | ORAL_SOLUTION | Freq: Four times a day (QID) | ORAL | 0 refills | Status: DC | PRN

## 2017-05-13 NOTE — Patient Instructions (Signed)

## 2017-05-13 NOTE — Progress Notes (Signed)
   Subjective:    Patient ID: Jasmine Stephens, female    DOB: Mar 18, 1951, 67 y.o.   MRN: [id number]  HPI Patient comes in today c/o congestion, facial pressure and headache. Low grade fever Sunday and Monday.    Review of Systems  Constitutional: Positive for appetite change (decreased). Negative for chills and fever.  HENT: Positive for congestion, postnasal drip, rhinorrhea, sinus pressure and sinus pain. Negative for sore throat and trouble swallowing.   Respiratory: Positive for cough. Negative for shortness of breath.   Cardiovascular: Negative.   Gastrointestinal: Negative.   Neurological: Negative.   Psychiatric/Behavioral: Negative.   All other systems reviewed and are negative.      Objective:   Physical Exam  Constitutional: She appears well-developed and well-nourished. She appears distressed (mild).  HENT:  Right Ear: Hearing, tympanic membrane, external ear and ear canal normal.  Left Ear: Hearing, tympanic membrane, external ear and ear canal normal.  Nose: Mucosal edema and rhinorrhea present. Right sinus exhibits maxillary sinus tenderness. Right sinus exhibits no frontal sinus tenderness. Left sinus exhibits maxillary sinus tenderness. Left sinus exhibits no frontal sinus tenderness.  Mouth/Throat: Uvula is midline, oropharynx is clear and moist and mucous membranes are normal.  Neck: Normal range of motion. Neck supple.  Cardiovascular: Normal rate and regular rhythm.  Pulmonary/Chest: Effort normal and breath sounds normal. No respiratory distress. She has no wheezes.  Abdominal: Soft. Bowel sounds are normal.  Lymphadenopathy:    She has no cervical adenopathy.  Skin: Skin is warm and dry.  Psychiatric: She has a normal mood and affect. Her behavior is normal. Judgment and thought content normal.   BP (!) 152/76   Pulse 67   Temp (!) 97.4 F (36.3 C) (Oral)   Ht 5\' 6"  (1.676 m)   BMI 34.96 kg/m       Assessment & Plan:  1. Acute maxillary sinusitis,  recurrence not specified 1. Take meds as prescribed 2. Use a cool mist humidifier especially during the winter months and when heat has been humid. 3. Use saline nose sprays frequently 4. Saline irrigations of the nose can be very helpful if done frequently.  * 4X daily for 1 week*  * Use of a nettie pot can be helpful with this. Follow directions with this* 5. Drink plenty of fluids 6. Keep thermostat turn down low 7.For any cough or congestion  Use plain Mucinex- regular strength or max strength is fine- no decongestants   * Children- consult with Pharmacist for dosing 8. For fever or aces or pains- take tylenol or ibuprofen appropriate for age and weight.  * for fevers greater than 101 orally you may alternate ibuprofen and tylenol every  3 hours.    - levofloxacin (LEVAQUIN) 500 MG tablet; Take 1 tablet (500 mg total) by mouth daily.  Dispense: 7 tablet; Refill: 0 - HYDROcodone-homatropine (HYCODAN) 5-1.5 MG/5ML syrup; Take 5 mLs by mouth every 6 (six) hours as needed for cough.  Dispense: 120 mL; Refill: 0  Mary-Margaret Hassell Done, FNP

## 2017-05-30 ENCOUNTER — Ambulatory Visit: Payer: BC Managed Care – PPO

## 2017-06-04 ENCOUNTER — Encounter: Payer: Self-pay | Admitting: Family Medicine

## 2017-06-10 ENCOUNTER — Ambulatory Visit: Payer: BC Managed Care – PPO | Admitting: Nurse Practitioner

## 2017-10-21 ENCOUNTER — Other Ambulatory Visit: Payer: Self-pay | Admitting: Nurse Practitioner

## 2017-10-21 DIAGNOSIS — Z1231 Encounter for screening mammogram for malignant neoplasm of breast: Secondary | ICD-10-CM

## 2017-10-24 ENCOUNTER — Ambulatory Visit: Payer: BC Managed Care – PPO

## 2017-10-24 ENCOUNTER — Encounter: Payer: Self-pay | Admitting: Nurse Practitioner

## 2017-10-24 ENCOUNTER — Ambulatory Visit (INDEPENDENT_AMBULATORY_CARE_PROVIDER_SITE_OTHER): Payer: BC Managed Care – PPO | Admitting: Nurse Practitioner

## 2017-10-24 ENCOUNTER — Encounter: Payer: BC Managed Care – PPO | Admitting: Nurse Practitioner

## 2017-10-24 VITALS — BP 148/91 | HR 80 | Temp 97.1°F | Ht 66.0 in | Wt 216.0 lb

## 2017-10-24 DIAGNOSIS — Z13820 Encounter for screening for osteoporosis: Secondary | ICD-10-CM

## 2017-10-24 DIAGNOSIS — Z1159 Encounter for screening for other viral diseases: Secondary | ICD-10-CM

## 2017-10-24 DIAGNOSIS — Z0000 Encounter for general adult medical examination without abnormal findings: Secondary | ICD-10-CM | POA: Diagnosis not present

## 2017-10-24 DIAGNOSIS — K219 Gastro-esophageal reflux disease without esophagitis: Secondary | ICD-10-CM

## 2017-10-24 DIAGNOSIS — K222 Esophageal obstruction: Secondary | ICD-10-CM

## 2017-10-24 DIAGNOSIS — Z6834 Body mass index (BMI) 34.0-34.9, adult: Secondary | ICD-10-CM | POA: Diagnosis not present

## 2017-10-24 DIAGNOSIS — K5730 Diverticulosis of large intestine without perforation or abscess without bleeding: Secondary | ICD-10-CM

## 2017-10-24 DIAGNOSIS — K582 Mixed irritable bowel syndrome: Secondary | ICD-10-CM

## 2017-10-24 DIAGNOSIS — F411 Generalized anxiety disorder: Secondary | ICD-10-CM

## 2017-10-24 DIAGNOSIS — Z23 Encounter for immunization: Secondary | ICD-10-CM

## 2017-10-24 LAB — URINALYSIS, COMPLETE
Bilirubin, UA: NEGATIVE
GLUCOSE, UA: NEGATIVE
KETONES UA: NEGATIVE
Leukocytes, UA: NEGATIVE
NITRITE UA: NEGATIVE
Protein, UA: NEGATIVE
RBC UA: NEGATIVE
Specific Gravity, UA: 1.02 (ref 1.005–1.030)
UUROB: 0.2 mg/dL (ref 0.2–1.0)
pH, UA: 5.5 (ref 5.0–7.5)

## 2017-10-24 LAB — MICROSCOPIC EXAMINATION
Bacteria, UA: NONE SEEN
Epithelial Cells (non renal): >10 /hpf — AB (ref 0–10)
RBC, UA: NONE SEEN /hpf (ref 0–2)
Renal Epithel, UA: NONE SEEN /hpf

## 2017-10-24 NOTE — Patient Instructions (Signed)
Bone Health Bones protect organs, store calcium, and anchor muscles. Good health habits, such as eating nutritious foods and exercising regularly, are important for maintaining healthy bones. They can also help to prevent a condition that causes bones to lose density and become weak and brittle (osteoporosis). Why is bone mass important? Bone mass refers to the amount of bone tissue that you have. The higher your bone mass, the stronger your bones. An important step toward having healthy bones throughout life is to have strong and dense bones during childhood. A young adult who has a high bone mass is more likely to have a high bone mass later in life. Bone mass at its greatest it is called peak bone mass. A large decline in bone mass occurs in older adults. In women, it occurs about the time of menopause. During this time, it is important to practice good health habits, because if more bone is lost than what is replaced, the bones will become less healthy and more likely to break (fracture). If you find that you have a low bone mass, you may be able to prevent osteoporosis or further bone loss by changing your diet and lifestyle. How can I find out if my bone mass is low? Bone mass can be measured with an X-ray test that is called a bone mineral density (BMD) test. This test is recommended for all women who are age 65 or older. It may also be recommended for men who are age 70 or older, or for people who are more likely to develop osteoporosis due to:  Having bones that break easily.  Having a long-term disease that weakens bones, such as kidney disease or rheumatoid arthritis.  Having menopause earlier than normal.  Taking medicine that weakens bones, such as steroids, thyroid hormones, or hormone treatment for breast cancer or prostate cancer.  Smoking.  Drinking three or more alcoholic drinks each day.  What are the nutritional recommendations for healthy bones? To have healthy bones, you  need to get enough of the right minerals and vitamins. Most nutrition experts recommend getting these nutrients from the foods that you eat. Nutritional recommendations vary from person to person. Ask your health care provider what is healthy for you. Here are some general guidelines. Calcium Recommendations Calcium is the most important (essential) mineral for bone health. Most people can get enough calcium from their diet, but supplements may be recommended for people who are at risk for osteoporosis. Good sources of calcium include:  Dairy products, such as low-fat or nonfat milk, cheese, and yogurt.  Dark green leafy vegetables, such as bok choy and broccoli.  Calcium-fortified foods, such as orange juice, cereal, bread, soy beverages, and tofu products.  Nuts, such as almonds.  Follow these recommended amounts for daily calcium intake:  Children, age 1?3: 700 mg.  Children, age 4?8: 1,000 mg.  Children, age 9?13: 1,300 mg.  Teens, age 14?18: 1,300 mg.  Adults, age 19?50: 1,000 mg.  Adults, age 51?70: ? Men: 1,000 mg. ? Women: 1,200 mg.  Adults, age 71 or older: 1,200 mg.  Pregnant and breastfeeding females: ? Teens: 1,300 mg. ? Adults: 1,000 mg.  Vitamin D Recommendations Vitamin D is the most essential vitamin for bone health. It helps the body to absorb calcium. Sunlight stimulates the skin to make vitamin D, so be sure to get enough sunlight. If you live in a cold climate or you do not get outside often, your health care provider may recommend that you take vitamin   D supplements. Good sources of vitamin D in your diet include:  Egg yolks.  Saltwater fish.  Milk and cereal fortified with vitamin D.  Follow these recommended amounts for daily vitamin D intake:  Children and teens, age 1?18: 600 international units.  Adults, age 50 or younger: 400-800 international units.  Adults, age 51 or older: 800-1,000 international units.  Other Nutrients Other nutrients  for bone health include:  Phosphorus. This mineral is found in meat, poultry, dairy foods, nuts, and legumes. The recommended daily intake for adult men and adult women is 700 mg.  Magnesium. This mineral is found in seeds, nuts, dark green vegetables, and legumes. The recommended daily intake for adult men is 400?420 mg. For adult women, it is 310?320 mg.  Vitamin K. This vitamin is found in green leafy vegetables. The recommended daily intake is 120 mg for adult men and 90 mg for adult women.  What type of physical activity is best for building and maintaining healthy bones? Weight-bearing and strength-building activities are important for building and maintaining peak bone mass. Weight-bearing activities cause muscles and bones to work against gravity. Strength-building activities increases muscle strength that supports bones. Weight-bearing and muscle-building activities include:  Walking and hiking.  Jogging and running.  Dancing.  Gym exercises.  Lifting weights.  Tennis and racquetball.  Climbing stairs.  Aerobics.  Adults should get at least 30 minutes of moderate physical activity on most days. Children should get at least 60 minutes of moderate physical activity on most days. Ask your health care provide what type of exercise is best for you. Where can I find more information? For more information, check out the following websites:  National Osteoporosis Foundation: http://nof.org/learn/basics  National Institutes of Health: http://www.niams.nih.gov/Health_Info/Bone/Bone_Health/bone_health_for_life.asp  This information is not intended to replace advice given to you by your health care provider. Make sure you discuss any questions you have with your health care provider. Document Released: 06/01/2003 Document Revised: 09/29/2015 Document Reviewed: 03/16/2014 Elsevier Interactive Patient Education  2018 Elsevier Inc.  

## 2017-10-24 NOTE — Progress Notes (Signed)
Subjective:    Patient ID: Jasmine Stephens, female    DOB: 1950-10-14, 67 y.o.   MRN: [id number]   Chief Complaint: Annual Exam   HPI:  1. Annual physical exam   2. Anxiety state  has occasional stress but does not want to be on meds  3. Irritable bowel syndrome with both constipation and diarrhea  Is on plexus fro weight loss. She has been going to bathroom regularly- no diarrhea or constipation.  4. Gastroesophageal reflux disease without esophagitis  Only takes OTC meds as needed. Needs no more then 2 x a week  5. Diverticulosis of colon  No recent flare ups  6. Stricture and stenosis of esophagus  Patient says that she doe sot know about this but is on chart. denies any trouble swallowing.  7.      BMI 34.0-34.9          No recent weight changes   Outpatient Encounter Medications as of 10/24/2017  Medication Sig  . acetaminophen (TYLENOL) 500 MG tablet Take 500 mg by mouth every 6 (six) hours as needed.  Marland Kitchen HYDROcodone-homatropine (HYCODAN) 5-1.5 MG/5ML syrup Take 5 mLs by mouth every 6 (six) hours as needed for cough.  Marland Kitchen levofloxacin (LEVAQUIN) 500 MG tablet Take 1 tablet (500 mg total) by mouth daily.  . Omega-3 Fatty Acids (FISH OIL PO) Take 2 capsules by mouth daily.  . Probiotic Product (PRO-BIOTIC BLEND PO) Take by mouth.  Marland Kitchen VITAMIN D, ERGOCALCIFEROL, PO Take 1 tablet by mouth daily. 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  HENT: Negative.   Eyes: Negative for pain.  Respiratory: Negative for shortness of breath.   Cardiovascular: Negative for chest pain, palpitations and leg swelling.  Gastrointestinal: Negative for abdominal pain.  Endocrine: Negative for polydipsia.  Genitourinary: Negative.   Skin: Negative for rash.  Neurological: Negative for dizziness, weakness and headaches.    Hematological: Does not bruise/bleed easily.  Psychiatric/Behavioral: Negative.   All other systems reviewed and are negative.      Objective:   Physical Exam  Constitutional: She is oriented to person, place, and time. She appears well-developed and well-nourished. No distress.  HENT:  Head: Normocephalic.  Nose: Nose normal.  Mouth/Throat: Oropharynx is clear and moist.  Eyes: Pupils are equal, round, and reactive to light. EOM are normal.  Neck: Normal range of motion. Neck supple. No JVD present. Carotid bruit is not present.  Cardiovascular: Normal rate, regular rhythm, normal heart sounds and intact distal pulses.  Pulmonary/Chest: Effort normal and breath sounds normal. No respiratory distress. She has no wheezes. She has no rales. She exhibits no tenderness.  Abdominal: Soft. Normal appearance, normal aorta and bowel sounds are normal. She exhibits no distension, no abdominal bruit, no pulsatile midline mass and no mass. There is no splenomegaly or hepatomegaly. There is no tenderness.  Genitourinary: Vagina normal. No vaginal discharge found.  Genitourinary Comments: Vaginal cuff intact No masses or tenderness  Musculoskeletal: Normal range of motion. She exhibits no edema.  Lymphadenopathy:    She has no cervical adenopathy.  Neurological: She is alert and oriented to person, place, and time. She has normal reflexes.  Skin: Skin is warm and dry.  Psychiatric: She has a normal mood and affect. Her behavior is normal. Judgment and thought content normal.  Nursing  note and vitals reviewed.     BP (!) 148/91   Pulse 80   Temp (!) 97.1 F (36.2 C) (Oral)   Ht _0  (1.676 m)   Wt 216 lb (98 kg)   BMI 34.86 kg/m      Assessment & Plan:  Jasmine Stephens comes in today with chief complaint of Annual Exam   Diagnosis and orders addressed:  1. Annual physical exam - Urinalysis, Complete - CBC with Differential/Platelet - CMP14+EGFR - Lipid panel - Thyroid Panel With  TSH - IGP, Aptima HPV, rfx 16/18,45  2. Anxiety state Stress management  3. Irritable bowel syndrome with both constipation and diarrhea Continue current regimen  4. Gastroesophageal reflux disease without esophagitis Avoid spicy foods Do not eat 2 hours prior to bedtime  5. Diverticulosis of colon Watch diet- avoid foods with small seeds and indigestable skin  6. Stricture and stenosis of esophagus  7. BMI 34.0-34.9,adult Discussed diet and exercise for person with BMI >25 Will recheck weight in 3-6 months    Labs pending Health Maintenance reviewed Diet and exercise encouraged  Follow up plan: 1 year   [place], FNP

## 2017-10-24 NOTE — Addendum Note (Signed)
Addended by: Rolena Infante on: 10/24/2017 03:57 PM   Modules accepted: Orders

## 2017-10-25 LAB — CBC WITH DIFFERENTIAL/PLATELET
BASOS ABS: 0.1 10*3/uL (ref 0.0–0.2)
Basos: 1 %
EOS (ABSOLUTE): 0.1 10*3/uL (ref 0.0–0.4)
Eos: 1 %
HEMOGLOBIN: 14.7 g/dL (ref 11.1–15.9)
Hematocrit: 44.5 % (ref 34.0–46.6)
Immature Grans (Abs): 0 10*3/uL (ref 0.0–0.1)
Immature Granulocytes: 0 %
LYMPHS ABS: 4 10*3/uL — AB (ref 0.7–3.1)
Lymphs: 38 %
MCH: 29.7 pg (ref 26.6–33.0)
MCHC: 33 g/dL (ref 31.5–35.7)
MCV: 90 fL (ref 79–97)
MONOCYTES: 7 %
MONOS ABS: 0.7 10*3/uL (ref 0.1–0.9)
NEUTROS ABS: 5.6 10*3/uL (ref 1.4–7.0)
Neutrophils: 53 %
PLATELETS: 381 10*3/uL (ref 150–450)
RBC: 4.95 x10E6/uL (ref 3.77–5.28)
RDW: 14 % (ref 12.3–15.4)
WBC: 10.5 10*3/uL (ref 3.4–10.8)

## 2017-10-25 LAB — HEPATITIS C ANTIBODY: Hep C Virus Ab: 0.3 s/co ratio (ref 0.0–0.9)

## 2017-10-25 LAB — THYROID PANEL WITH TSH
Free Thyroxine Index: 2 (ref 1.2–4.9)
T3 Uptake Ratio: 26 % (ref 24–39)
T4 TOTAL: 7.6 ug/dL (ref 4.5–12.0)
TSH: 2.68 u[IU]/mL (ref 0.450–4.500)

## 2017-10-25 LAB — LIPID PANEL
Chol/HDL Ratio: 4.3 ratio (ref 0.0–4.4)
Cholesterol, Total: 251 mg/dL — ABNORMAL HIGH (ref 100–199)
HDL: 59 mg/dL (ref >39)
LDL CALC: 177 mg/dL — AB (ref 0–99)
Triglycerides: 76 mg/dL (ref 0–149)
VLDL CHOLESTEROL CAL: 15 mg/dL (ref 5–40)

## 2017-10-25 LAB — CMP14+EGFR
ALBUMIN: 4.5 g/dL (ref 3.6–4.8)
ALK PHOS: 100 IU/L (ref 39–117)
ALT: 23 IU/L (ref 0–32)
AST: 17 IU/L (ref 0–40)
Albumin/Globulin Ratio: 1.7 (ref 1.2–2.2)
BILIRUBIN TOTAL: 0.3 mg/dL (ref 0.0–1.2)
BUN / CREAT RATIO: 19 (ref 12–28)
BUN: 18 mg/dL (ref 8–27)
CHLORIDE: 100 mmol/L (ref 96–106)
CO2: 26 mmol/L (ref 20–29)
CREATININE: 0.93 mg/dL (ref 0.57–1.00)
Calcium: 9.9 mg/dL (ref 8.7–10.3)
GFR calc non Af Amer: 65 mL/min/{1.73_m2} (ref >59)
GFR, EST AFRICAN AMERICAN: 75 mL/min/{1.73_m2} (ref >59)
GLUCOSE: 107 mg/dL — AB (ref 65–99)
Globulin, Total: 2.7 g/dL (ref 1.5–4.5)
Potassium: 4.7 mmol/L (ref 3.5–5.2)
Sodium: 141 mmol/L (ref 134–144)
Total Protein: 7.2 g/dL (ref 6.0–8.5)

## 2017-10-28 LAB — IGP, APTIMA HPV, RFX 16/18,45
HPV Aptima: NEGATIVE
PAP Smear Comment: 0

## 2017-11-20 ENCOUNTER — Ambulatory Visit: Payer: BC Managed Care – PPO

## 2017-12-23 ENCOUNTER — Ambulatory Visit: Payer: BC Managed Care – PPO

## 2018-02-02 ENCOUNTER — Ambulatory Visit: Payer: BC Managed Care – PPO

## 2018-02-17 ENCOUNTER — Telehealth: Payer: Self-pay | Admitting: *Deleted

## 2018-02-17 NOTE — Telephone Encounter (Signed)
Pt declined flu shot °

## 2018-02-22 HISTORY — PX: REPLACEMENT TOTAL KNEE: SUR1224

## 2018-03-16 ENCOUNTER — Ambulatory Visit: Payer: BC Managed Care – PPO

## 2018-04-27 ENCOUNTER — Ambulatory Visit: Payer: BC Managed Care – PPO | Admitting: Nurse Practitioner

## 2018-04-27 ENCOUNTER — Encounter: Payer: Self-pay | Admitting: Nurse Practitioner

## 2018-04-27 VITALS — BP 138/82 | HR 87 | Temp 99.4°F | Ht 66.0 in | Wt 211.0 lb

## 2018-04-27 DIAGNOSIS — J069 Acute upper respiratory infection, unspecified: Secondary | ICD-10-CM

## 2018-04-27 MED ORDER — METHYLPREDNISOLONE ACETATE 80 MG/ML IJ SUSP
80.0000 mg | Freq: Once | INTRAMUSCULAR | Status: AC
Start: 2018-04-27 — End: 2018-04-27
  Administered 2018-04-27: 80 mg via INTRAMUSCULAR

## 2018-04-27 MED ORDER — HYDROCODONE-HOMATROPINE 5-1.5 MG/5ML PO SYRP: 5.0000 mL | ORAL_SOLUTION | Freq: Four times a day (QID) | ORAL | 0 refills | Status: DC | PRN

## 2018-04-27 NOTE — Progress Notes (Signed)
Subjective:    Patient ID: Jasmine Stephens, female    DOB: February 06, 1951, 68 y.o.   MRN: [id number]   Chief Complaint: URI (chills, h/a, sore throat, ear pain and cough x 1 wk)   HPI Patient comes in c/o cough , fever, chills, sore throat and ear pain for over 9 days ago. Cough has gotten worse..    Review of Systems  Constitutional: Positive for chills and fever.  HENT: Positive for congestion, ear pain, rhinorrhea, sinus pressure, sinus pain, sore throat and trouble swallowing. Negative for voice change.   Respiratory: Positive for cough (productive- yellowish green).   Cardiovascular: Negative.   Musculoskeletal: Positive for myalgias.  Neurological: Negative.   Psychiatric/Behavioral: Negative.   All other systems reviewed and are negative.      Objective:   Physical Exam Vitals signs and nursing note reviewed.  Constitutional:      General: She is not in acute distress.    Appearance: She is normal weight.  HENT:     Right Ear: Hearing, tympanic membrane, ear canal and external ear normal.     Left Ear: Hearing, tympanic membrane, ear canal and external ear normal.     Nose: Mucosal edema, congestion and rhinorrhea present.     Right Sinus: No maxillary sinus tenderness or frontal sinus tenderness.     Left Sinus: No maxillary sinus tenderness or frontal sinus tenderness.     Mouth/Throat:     Lips: Pink.     Pharynx: Oropharynx is clear.  Neck:     Musculoskeletal: Normal range of motion and neck supple.  Cardiovascular:     Rate and Rhythm: Normal rate and regular rhythm.     Heart sounds: Normal heart sounds.  Pulmonary:     Effort: Pulmonary effort is normal.     Breath sounds: Normal breath sounds.     Comments: Deep dry cough  Abdominal:     General: Abdomen is flat.     Palpations: Abdomen is soft.  Lymphadenopathy:     Cervical: No cervical adenopathy.  Skin:    General: Skin is warm and dry.  Neurological:     General: No focal deficit present.   Mental Status: She is alert and oriented to person, place, and time.  Psychiatric:        Behavior: Behavior normal.    BP 138/82   Pulse 87   Temp 99.4 F (37.4 C) (Oral)   Wt 211 lb (95.7 kg)   BMI 34.06 kg/m       Assessment & Plan:  Jasmine Stephens in today with chief complaint of URI (chills, h/a, sore throat, ear pain and cough x 1 wk)   1. Upper respiratory infection with cough and congestion 1. Take meds as prescribed 2. Use a cool mist humidifier especially during the winter months and when heat has been humid. 3. Use saline nose sprays frequently 4. Saline irrigations of the nose can be very helpful if done frequently.  * 4X daily for 1 week*  * Use of a nettie pot can be helpful with this. Follow directions with this* 5. Drink plenty of fluids 6. Keep thermostat turn down low 7.For any cough or congestion  Use plain Mucinex- regular strength or max strength is fine   * Children- consult with Pharmacist for dosing 8. For fever or aces or pains- take tylenol or ibuprofen appropriate for age and weight.  * for fevers greater than 101 orally you may alternate ibuprofen and tylenol every  3 hours.    - methylPREDNISolone acetate (DEPO-MEDROL) injection 80 mg - HYDROcodone-homatropine (HYCODAN) 5-1.5 MG/5ML syrup; Take 5 mLs by mouth every 6 (six) hours as needed for cough.  Dispense: 120 mL; Refill: 0  Mary-Margaret Hassell Done, FNP

## 2018-04-27 NOTE — Patient Instructions (Signed)

## 2018-04-28 LAB — RAPID STREP SCREEN (MED CTR MEBANE ONLY): STREP GP A AG, IA W/REFLEX: NEGATIVE

## 2018-04-28 LAB — VERITOR FLU A/B WAIVED
Influenza A: NEGATIVE
Influenza B: NEGATIVE

## 2018-04-28 LAB — CULTURE, GROUP A STREP

## 2018-04-29 ENCOUNTER — Ambulatory Visit: Payer: BC Managed Care – PPO

## 2018-05-07 ENCOUNTER — Other Ambulatory Visit: Payer: Self-pay | Admitting: Nurse Practitioner

## 2018-05-07 DIAGNOSIS — J069 Acute upper respiratory infection, unspecified: Secondary | ICD-10-CM

## 2018-05-07 NOTE — Telephone Encounter (Signed)
Last seen 04/27/17  MMM

## 2018-05-11 ENCOUNTER — Ambulatory Visit: Payer: BC Managed Care – PPO

## 2018-09-17 ENCOUNTER — Telehealth: Payer: Self-pay | Admitting: Gastroenterology

## 2018-09-17 NOTE — Telephone Encounter (Signed)
Given family history of colon cancer in 2 relatives a 5 year interval for colonoscopy is recommended so she is due for colonoscopy in 2020. She was Dr. Nichola Sizer patient. I cannot find a record in Epic that I saw her as a patient so she can decide which physician in our group she wants to establish with going forward.

## 2018-09-17 NOTE — Telephone Encounter (Signed)
The patient will be notified of Dr. Lynne Leader recommendations. My apologies, I misread the chart, a telephone visit with Dr. Fuller Plan was right above the office visit with Dr. Olevia Perches.

## 2018-09-17 NOTE — Telephone Encounter (Signed)
Patient was last seen in the office by Dr. Fuller Plan on 05/07/2013, had a colonoscopy with Dr. Olevia Perches on 05/31/2013, per the procedure report, the patient is to have a repeat 10 year colonoscopy in 2025. The patient wants to confirm waiting 10 years is the appropriate plan due to her family history of colon cancer. Please advise.

## 2018-09-17 NOTE — Telephone Encounter (Signed)
Patient called would like to know when she is due for her colonoscopy I told her 05-2023. She said that last she herd was that it should be sooner because she has Family Hx of Colon cancer. She would like to verify if it should be a recall for 30yrs.

## 2018-09-17 NOTE — Telephone Encounter (Signed)
Spoke to the patient, scheduler Tomie China will call the patient to set the patient up with an appt.

## 2018-10-08 ENCOUNTER — Other Ambulatory Visit: Payer: Self-pay

## 2018-10-08 ENCOUNTER — Ambulatory Visit: Payer: BC Managed Care – PPO | Admitting: *Deleted

## 2018-10-08 VITALS — Ht 66.0 in | Wt 200.0 lb

## 2018-10-08 DIAGNOSIS — Z800 Family history of malignant neoplasm of digestive organs: Secondary | ICD-10-CM

## 2018-10-08 DIAGNOSIS — Z86010 Personal history of colonic polyps: Secondary | ICD-10-CM

## 2018-10-08 MED ORDER — SUPREP BOWEL PREP KIT 17.5-3.13-1.6 GM/177ML PO SOLN
1.0000 | Freq: Once | ORAL | 0 refills | Status: AC
Start: 2018-10-08 — End: 2018-10-08

## 2018-10-08 NOTE — Progress Notes (Signed)
Patient denies any allergies to egg or soy products. Patient denies complications with anesthesia/sedation.  Patient denies oxygen use at home and denies diet medications.   Pt verified name, DOB, address and insurance during PV today. Pt mailed instruction packet to included paper to complete and mail back to [facility] with addressed and stamped envelope, Emmi video, copy of consent form to read and not return, and instructions.  PV completed over the phone. Pt encouraged to call with questions or concerns.

## 2018-10-20 ENCOUNTER — Encounter: Payer: Self-pay | Admitting: Gastroenterology

## 2018-10-21 ENCOUNTER — Encounter: Payer: BC Managed Care – PPO | Admitting: Gastroenterology

## 2018-10-28 ENCOUNTER — Telehealth: Payer: Self-pay | Admitting: Gastroenterology

## 2018-10-28 NOTE — Telephone Encounter (Signed)

## 2018-10-29 ENCOUNTER — Other Ambulatory Visit: Payer: Self-pay

## 2018-10-29 ENCOUNTER — Encounter: Payer: Self-pay | Admitting: Gastroenterology

## 2018-10-29 ENCOUNTER — Ambulatory Visit (AMBULATORY_SURGERY_CENTER): Payer: Medicare Other | Admitting: Gastroenterology

## 2018-10-29 VITALS — BP 120/68 | HR 78 | Temp 98.0°F | Resp 13 | Ht 66.0 in | Wt 200.0 lb

## 2018-10-29 DIAGNOSIS — D125 Benign neoplasm of sigmoid colon: Secondary | ICD-10-CM

## 2018-10-29 DIAGNOSIS — D123 Benign neoplasm of transverse colon: Secondary | ICD-10-CM | POA: Diagnosis not present

## 2018-10-29 DIAGNOSIS — Z86010 Personal history of colonic polyps: Secondary | ICD-10-CM

## 2018-10-29 DIAGNOSIS — K635 Polyp of colon: Secondary | ICD-10-CM | POA: Diagnosis not present

## 2018-10-29 DIAGNOSIS — D122 Benign neoplasm of ascending colon: Secondary | ICD-10-CM

## 2018-10-29 DIAGNOSIS — Z800 Family history of malignant neoplasm of digestive organs: Secondary | ICD-10-CM

## 2018-10-29 MED ORDER — SODIUM CHLORIDE 0.9 % IV SOLN: 500.0000 mL | Freq: Once | INTRAVENOUS | Status: DC

## 2018-10-29 NOTE — Progress Notes (Signed)
Called to room to assist during endoscopic procedure.  Patient ID and intended procedure confirmed with present staff. Received instructions for my participation in the procedure from the performing physician.  

## 2018-10-29 NOTE — Patient Instructions (Signed)
YOU HAD AN ENDOSCOPIC PROCEDURE TODAY AT THE [facility]:   Refer to the procedure report that was given to you for any specific questions about what was found during the examination.  If the procedure report does not answer your questions, please call your gastroenterologist to clarify.  If you requested that your care partner not be given the details of your procedure findings, then the procedure report has been included in a sealed envelope for you to review at your convenience later.  YOU SHOULD EXPECT: Some feelings of bloating in the abdomen. Passage of more gas than usual.  Walking can help get rid of the air that was put into your GI tract during the procedure and reduce the bloating. If you had a lower endoscopy (such as a colonoscopy or flexible sigmoidoscopy) you may notice spotting of blood in your stool or on the toilet paper. If you underwent a bowel prep for your procedure, you may not have a normal bowel movement for a few days.  Please Note:  You might notice some irritation and congestion in your nose or some drainage.  This is from the oxygen used during your procedure.  There is no need for concern and it should clear up in a day or so.  SYMPTOMS TO REPORT IMMEDIATELY:   Following lower endoscopy (colonoscopy or flexible sigmoidoscopy):  Excessive amounts of blood in the stool  Significant tenderness or worsening of abdominal pains  Swelling of the abdomen that is new, acute  Fever of 100F or higher  For urgent or emergent issues, a gastroenterologist can be reached at any hour by calling [phone].   DIET:  We do recommend a small meal at first, but then you may proceed to your regular diet.  Drink plenty of fluids but you should avoid alcoholic beverages for 24 hours.  ACTIVITY:  You should plan to take it easy for the rest of today and you should NOT DRIVE or use heavy machinery until tomorrow (because of the sedation medicines used during the test).     FOLLOW UP: Our staff will call the number listed on your records 48-72 hours following your procedure to check on you and address any questions or concerns that you may have regarding the information given to you following your procedure. If we do not reach you, we will leave a message.  We will attempt to reach you two times.  During this call, we will ask if you have developed any symptoms of COVID 19. If you develop any symptoms (ie: fever, flu-like symptoms, shortness of breath, cough etc.) before then, please call [phone].  If you test positive for Covid 19 in the 2 weeks post procedure, please call and report this information to us.    If any biopsies were taken you will be contacted by phone or by letter within the next 1-3 weeks.  Please call us at [phone] if you have not heard about the biopsies in 3 weeks.    SIGNATURES/CONFIDENTIALITY: You and/or your care partner have signed paperwork which will be entered into your electronic medical record.  These signatures attest to the fact that that the information above on your After Visit Summary has been reviewed and is understood.  Full responsibility of the confidentiality of this discharge information lies with you and/or your care-partner. 

## 2018-10-29 NOTE — Progress Notes (Signed)
PT taken to PACU. Monitors in place. VSS. Report given to RN. 

## 2018-10-29 NOTE — Op Note (Signed)
[place] Patient Name: Jasmine Stephens Procedure Date: 10/29/2018 9:13 AM MRN: [id number] Endoscopist: Ladene Artist , MD Age: 68 Referring MD:  Date of Birth: Jun 22, 1950 Gender: Female Account #: [id number] Procedure:                Colonoscopy Indications:              Surveillance: Personal history of adenomatous                            polyps on last colonoscopy 5 years ago. Family                            history of colon cancer, multiple distant relatives. Medicines:                Monitored Anesthesia Care Procedure:                Pre-Anesthesia Assessment:                           - Prior to the procedure, a History and Physical                            was performed, and patient medications and                            allergies were reviewed. The patient's tolerance of                            previous anesthesia was also reviewed. The risks                            and benefits of the procedure and the sedation                            options and risks were discussed with the patient.                            All questions were answered, and informed consent                            was obtained. Prior Anticoagulants: The patient has                            taken no previous anticoagulant or antiplatelet                            agents. ASA Grade Assessment: II - A patient with                            mild systemic disease. After reviewing the risks                            and benefits, the patient was deemed in  satisfactory condition to undergo the procedure.                           After obtaining informed consent, the colonoscope                            was passed under direct vision. Throughout the                            procedure, the patient's blood pressure, pulse, and                            oxygen saturations were monitored continuously. The                            Colonoscope was  introduced through the anus and                            advanced to the the cecum, identified by                            appendiceal orifice and ileocecal valve. The                            ileocecal valve, appendiceal orifice, and rectum                            were photographed. The quality of the bowel                            preparation was adequate. The patient tolerated the                            procedure well. The colonoscopy was somewhat                            difficult due to a redundant colon and a tortuous                            colon. Scope In: 9:25:38 AM Scope Out: 9:52:37 AM Scope Withdrawal Time: 0 hours 20 minutes 40 seconds  Total Procedure Duration: 0 hours 26 minutes 59 seconds  Findings:                 The perianal and digital rectal examinations were                            normal.                           A 12 mm polyp was found in the ascending colon. The                            polyp was sessile. The polyp was removed with a hot  snare. Resection and retrieval were complete.                           A 4 mm polyp was found in the transverse colon. The                            polyp was sessile. The polyp was removed with a                            cold biopsy forceps. Resection and retrieval were                            complete.                           Two sessile polyps were found in the sigmoid colon.                            The polyps were 6 mm in size. These polyps were                            removed with a cold snare. Resection and retrieval                            were complete.                           Multiple medium-mouthed diverticula were found in                            the entire colon. More concertrated in the left                            colon where there was narrowing of the colon in                            association with the diverticular opening. There                             was evidence of diverticular spasm. There was no                            evidence of diverticular bleeding.                           The exam was otherwise without abnormality on                            direct and retroflexion views. Complications:            No immediate complications. Estimated blood loss:                            None. Estimated Blood Loss:     Estimated blood loss:  none. Impression:               - One 12 mm polyp in the ascending colon, removed                            with a hot snare. Resected and retrieved.                           - One 4 mm polyp in the transverse colon, removed                            with a cold biopsy forceps. Resected and retrieved.                           - Two 6 mm polyps in the sigmoid colon, removed                            with a cold snare. Resected and retrieved.                           - Moderate diverticulosis in the entire examined                            colon. More concentrated in the left colon.                           - The examination was otherwise normal on direct                            and retroflexion views. Recommendation:           - Repeat colonoscopy in 3 years for surveillance,                            pending pathology review.                           - Patient has a contact number available for                            emergencies. The signs and symptoms of potential                            delayed complications were discussed with the                            patient. Return to normal activities tomorrow.                            Written discharge instructions were provided to the                            patient.                           -  High fiber diet.                           - Continue present medications.                           - Await pathology results.                           - No aspirin, ibuprofen, naproxen, or other                             non-steroidal anti-inflammatory drugs for 2 weeks                            after polyp removal. Ladene Artist, MD 10/29/2018 9:59:12 AM This report has been signed electronically.

## 2018-11-02 ENCOUNTER — Telehealth: Payer: Self-pay | Admitting: *Deleted

## 2018-11-02 NOTE — Telephone Encounter (Signed)
Mailbox is full - unable to leave a message. 

## 2018-11-08 ENCOUNTER — Encounter: Payer: Self-pay | Admitting: Gastroenterology

## 2018-12-17 ENCOUNTER — Encounter (HOSPITAL_COMMUNITY): Payer: Self-pay | Admitting: Physician Assistant

## 2018-12-17 ENCOUNTER — Encounter (HOSPITAL_COMMUNITY)
Admission: EM | Disposition: A | Discharge status: Home / Self Care | Payer: Self-pay | Source: Home / Self Care | Attending: Internal Medicine

## 2018-12-17 ENCOUNTER — Inpatient Hospital Stay (HOSPITAL_COMMUNITY)
Admission: EM | Admit: 2018-12-17 | Discharge: 2018-12-19 | DRG: 247 | Disposition: A | Discharge status: Home / Self Care | Payer: Medicare Other | Attending: Cardiology | Admitting: Cardiology

## 2018-12-17 ENCOUNTER — Ambulatory Visit (HOSPITAL_COMMUNITY): Admit: 2018-12-17 | Payer: BC Managed Care – PPO | Admitting: Internal Medicine

## 2018-12-17 DIAGNOSIS — I4510 Unspecified right bundle-branch block: Secondary | ICD-10-CM | POA: Diagnosis present

## 2018-12-17 DIAGNOSIS — K589 Irritable bowel syndrome without diarrhea: Secondary | ICD-10-CM | POA: Diagnosis present

## 2018-12-17 DIAGNOSIS — Z880 Allergy status to penicillin: Secondary | ICD-10-CM | POA: Diagnosis not present

## 2018-12-17 DIAGNOSIS — F419 Anxiety disorder, unspecified: Secondary | ICD-10-CM | POA: Diagnosis present

## 2018-12-17 DIAGNOSIS — E782 Mixed hyperlipidemia: Secondary | ICD-10-CM | POA: Diagnosis present

## 2018-12-17 DIAGNOSIS — E668 Other obesity: Secondary | ICD-10-CM | POA: Diagnosis present

## 2018-12-17 DIAGNOSIS — E785 Hyperlipidemia, unspecified: Secondary | ICD-10-CM | POA: Diagnosis present

## 2018-12-17 DIAGNOSIS — Z955 Presence of coronary angioplasty implant and graft: Secondary | ICD-10-CM

## 2018-12-17 DIAGNOSIS — Z20828 Contact with and (suspected) exposure to other viral communicable diseases: Secondary | ICD-10-CM | POA: Diagnosis present

## 2018-12-17 DIAGNOSIS — K219 Gastro-esophageal reflux disease without esophagitis: Secondary | ICD-10-CM | POA: Diagnosis present

## 2018-12-17 DIAGNOSIS — E669 Obesity, unspecified: Secondary | ICD-10-CM | POA: Diagnosis present

## 2018-12-17 DIAGNOSIS — Z800 Family history of malignant neoplasm of digestive organs: Secondary | ICD-10-CM | POA: Diagnosis not present

## 2018-12-17 DIAGNOSIS — Z91040 Latex allergy status: Secondary | ICD-10-CM

## 2018-12-17 DIAGNOSIS — I340 Nonrheumatic mitral (valve) insufficiency: Secondary | ICD-10-CM | POA: Diagnosis present

## 2018-12-17 DIAGNOSIS — Z888 Allergy status to other drugs, medicaments and biological substances status: Secondary | ICD-10-CM | POA: Diagnosis not present

## 2018-12-17 DIAGNOSIS — I493 Ventricular premature depolarization: Secondary | ICD-10-CM | POA: Diagnosis present

## 2018-12-17 DIAGNOSIS — F329 Major depressive disorder, single episode, unspecified: Secondary | ICD-10-CM | POA: Diagnosis present

## 2018-12-17 DIAGNOSIS — I2510 Atherosclerotic heart disease of native coronary artery without angina pectoris: Secondary | ICD-10-CM | POA: Diagnosis present

## 2018-12-17 DIAGNOSIS — R7303 Prediabetes: Secondary | ICD-10-CM | POA: Diagnosis present

## 2018-12-17 DIAGNOSIS — Z6834 Body mass index (BMI) 34.0-34.9, adult: Secondary | ICD-10-CM

## 2018-12-17 DIAGNOSIS — I2111 ST elevation (STEMI) myocardial infarction involving right coronary artery: Principal | ICD-10-CM | POA: Diagnosis present

## 2018-12-17 DIAGNOSIS — I25111 Atherosclerotic heart disease of native coronary artery with angina pectoris with documented spasm: Secondary | ICD-10-CM | POA: Diagnosis not present

## 2018-12-17 DIAGNOSIS — Z8249 Family history of ischemic heart disease and other diseases of the circulatory system: Secondary | ICD-10-CM | POA: Diagnosis not present

## 2018-12-17 DIAGNOSIS — Z86010 Personal history of colonic polyps: Secondary | ICD-10-CM

## 2018-12-17 DIAGNOSIS — I213 ST elevation (STEMI) myocardial infarction of unspecified site: Secondary | ICD-10-CM

## 2018-12-17 HISTORY — PX: LEFT HEART CATH AND CORONARY ANGIOGRAPHY: CATH118249

## 2018-12-17 HISTORY — PX: CORONARY/GRAFT ACUTE MI REVASCULARIZATION: CATH118305

## 2018-12-17 HISTORY — DX: ST elevation (STEMI) myocardial infarction of unspecified site: I21.3

## 2018-12-17 LAB — COMPREHENSIVE METABOLIC PANEL
ALT: 21 U/L (ref 0–44)
AST: 21 U/L (ref 15–41)
Albumin: 3.3 g/dL — ABNORMAL LOW (ref 3.5–5.0)
Alkaline Phosphatase: 72 U/L (ref 38–126)
Anion gap: 8 (ref 5–15)
BUN: 19 mg/dL (ref 8–23)
CO2: 20 mmol/L — ABNORMAL LOW (ref 22–32)
Calcium: 8.5 mg/dL — ABNORMAL LOW (ref 8.9–10.3)
Chloride: 109 mmol/L (ref 98–111)
Creatinine, Ser: 0.94 mg/dL (ref 0.44–1.00)
GFR calc Af Amer: >60 mL/min (ref >60)
GFR calc non Af Amer: >60 mL/min (ref >60)
Glucose, Bld: 177 mg/dL — ABNORMAL HIGH (ref 70–99)
Potassium: 4.1 mmol/L (ref 3.5–5.1)
Sodium: 137 mmol/L (ref 135–145)
Total Bilirubin: <0.1 mg/dL — ABNORMAL LOW (ref 0.3–1.2)
Total Protein: 6.2 g/dL — ABNORMAL LOW (ref 6.5–8.1)

## 2018-12-17 LAB — LIPID PANEL
Cholesterol: 214 mg/dL — ABNORMAL HIGH (ref 0–200)
HDL: 41 mg/dL (ref >40)
LDL Cholesterol: 138 mg/dL — ABNORMAL HIGH (ref 0–99)
Total CHOL/HDL Ratio: 5.2 RATIO
Triglycerides: 177 mg/dL — ABNORMAL HIGH (ref <150)
VLDL: 35 mg/dL (ref 0–40)

## 2018-12-17 LAB — POCT ACTIVATED CLOTTING TIME
Activated Clotting Time: 224 seconds
Activated Clotting Time: 241 seconds
Activated Clotting Time: 252 seconds
Activated Clotting Time: 263 seconds

## 2018-12-17 LAB — POCT I-STAT, CHEM 8
BUN: 20 mg/dL (ref 8–23)
Calcium, Ion: 1.22 mmol/L (ref 1.15–1.40)
Chloride: 107 mmol/L (ref 98–111)
Creatinine, Ser: 0.8 mg/dL (ref 0.44–1.00)
Glucose, Bld: 174 mg/dL — ABNORMAL HIGH (ref 70–99)
HCT: 37 % (ref 36.0–46.0)
Hemoglobin: 12.6 g/dL (ref 12.0–15.0)
Potassium: 4.2 mmol/L (ref 3.5–5.1)
Sodium: 141 mmol/L (ref 135–145)
TCO2: 22 mmol/L (ref 22–32)

## 2018-12-17 LAB — CBC
HCT: 37.7 % (ref 36.0–46.0)
Hemoglobin: 12.5 g/dL (ref 12.0–15.0)
MCH: 29.5 pg (ref 26.0–34.0)
MCHC: 33.2 g/dL (ref 30.0–36.0)
MCV: 88.9 fL (ref 80.0–100.0)
Platelets: 292 10*3/uL (ref 150–400)
RBC: 4.24 MIL/uL (ref 3.87–5.11)
RDW: 12.7 % (ref 11.5–15.5)
WBC: 8.8 10*3/uL (ref 4.0–10.5)
nRBC: 0 % (ref 0.0–0.2)

## 2018-12-17 LAB — SARS CORONAVIRUS 2 BY RT PCR (HOSPITAL ORDER, PERFORMED IN ~~LOC~~ HOSPITAL LAB): SARS Coronavirus 2: NEGATIVE

## 2018-12-17 LAB — HEMOGLOBIN A1C
Hgb A1c MFr Bld: 6 % — ABNORMAL HIGH (ref 4.8–5.6)
Hgb A1c MFr Bld: 6 % — ABNORMAL HIGH (ref 4.8–5.6)
Mean Plasma Glucose: 125.5 mg/dL
Mean Plasma Glucose: 125.5 mg/dL

## 2018-12-17 LAB — PROTIME-INR
INR: 1 (ref 0.8–1.2)
Prothrombin Time: 13.4 seconds (ref 11.4–15.2)

## 2018-12-17 LAB — APTT: aPTT: 27 seconds (ref 24–36)

## 2018-12-17 LAB — TROPONIN I (HIGH SENSITIVITY)
Troponin I (High Sensitivity): 17 ng/L (ref <18)
Troponin I (High Sensitivity): 9227 ng/L (ref <18)
Troponin I (High Sensitivity): 10506 ng/L (ref <18)

## 2018-12-17 LAB — TSH: TSH: 1.614 u[IU]/mL (ref 0.350–4.500)

## 2018-12-17 SURGERY — LEFT HEART CATH AND CORONARY ANGIOGRAPHY
Anesthesia: LOCAL

## 2018-12-17 MED ORDER — NITROGLYCERIN 0.4 MG SL SUBL: 0.4000 mg | SUBLINGUAL_TABLET | SUBLINGUAL | Status: DC | PRN

## 2018-12-17 MED ORDER — ALPRAZOLAM 0.25 MG PO TABS
0.2500 mg | ORAL_TABLET | Freq: Two times a day (BID) | ORAL | Status: DC | PRN
  Administered 2018-12-17 – 2018-12-18 (×2): 0.25 mg via ORAL
  Filled 2018-12-17 (×3): qty 1

## 2018-12-17 MED ORDER — LIDOCAINE HCL (PF) 1 % IJ SOLN
INTRAMUSCULAR | Status: AC
  Filled 2018-12-17: qty 30

## 2018-12-17 MED ORDER — SODIUM CHLORIDE 0.9% FLUSH: 3.0000 mL | Freq: Two times a day (BID) | INTRAVENOUS | Status: DC

## 2018-12-17 MED ORDER — SODIUM CHLORIDE 0.9% FLUSH: 3.0000 mL | INTRAVENOUS | Status: DC | PRN

## 2018-12-17 MED ORDER — HEPARIN (PORCINE) IN NACL 1000-0.9 UT/500ML-% IV SOLN
INTRAVENOUS | Status: AC
  Filled 2018-12-17: qty 1000

## 2018-12-17 MED ORDER — SODIUM CHLORIDE 0.9 % IV SOLN: 250.0000 mL | INTRAVENOUS | Status: DC | PRN

## 2018-12-17 MED ORDER — ATORVASTATIN CALCIUM 80 MG PO TABS: 80.0000 mg | ORAL_TABLET | Freq: Every day | ORAL | Status: DC

## 2018-12-17 MED ORDER — NITROGLYCERIN 1 MG/10 ML FOR IR/CATH LAB
INTRA_ARTERIAL | Status: DC | PRN
  Administered 2018-12-17 (×2): 100 ug via INTRACORONARY

## 2018-12-17 MED ORDER — FENTANYL CITRATE (PF) 100 MCG/2ML IJ SOLN
INTRAMUSCULAR | Status: AC
  Filled 2018-12-17: qty 2

## 2018-12-17 MED ORDER — SODIUM CHLORIDE 0.9% FLUSH
3.0000 mL | Freq: Two times a day (BID) | INTRAVENOUS | Status: DC
  Administered 2018-12-18 – 2018-12-19 (×3): 3 mL via INTRAVENOUS

## 2018-12-17 MED ORDER — ENOXAPARIN SODIUM 40 MG/0.4ML ~~LOC~~ SOLN
40.0000 mg | SUBCUTANEOUS | Status: DC
  Administered 2018-12-18 – 2018-12-19 (×2): 40 mg via SUBCUTANEOUS
  Filled 2018-12-17 (×2): qty 0.4

## 2018-12-17 MED ORDER — HEPARIN SODIUM (PORCINE) 1000 UNIT/ML IJ SOLN
INTRAMUSCULAR | Status: AC
  Filled 2018-12-17: qty 1

## 2018-12-17 MED ORDER — VERAPAMIL HCL 2.5 MG/ML IV SOLN
INTRAVENOUS | Status: AC
  Filled 2018-12-17: qty 2

## 2018-12-17 MED ORDER — SODIUM CHLORIDE 0.9 % IV SOLN
INTRAVENOUS | Status: AC | PRN
  Administered 2018-12-17: 75 mL/h via INTRAVENOUS

## 2018-12-17 MED ORDER — IOHEXOL 350 MG/ML SOLN
INTRAVENOUS | Status: DC | PRN
  Administered 2018-12-17: 17:00:00 115 mL

## 2018-12-17 MED ORDER — METOPROLOL TARTRATE 12.5 MG HALF TABLET
12.5000 mg | ORAL_TABLET | Freq: Two times a day (BID) | ORAL | Status: DC
  Administered 2018-12-17 – 2018-12-19 (×4): 12.5 mg via ORAL
  Filled 2018-12-17 (×4): qty 1

## 2018-12-17 MED ORDER — TICAGRELOR 90 MG PO TABS
90.0000 mg | ORAL_TABLET | Freq: Two times a day (BID) | ORAL | Status: DC
  Administered 2018-12-18 – 2018-12-19 (×4): 90 mg via ORAL
  Filled 2018-12-17 (×4): qty 1

## 2018-12-17 MED ORDER — ACETAMINOPHEN 325 MG PO TABS: 650.0000 mg | ORAL_TABLET | ORAL | Status: DC | PRN

## 2018-12-17 MED ORDER — MIDAZOLAM HCL 2 MG/2ML IJ SOLN
INTRAMUSCULAR | Status: DC | PRN
  Administered 2018-12-17: 0.5 mg via INTRAVENOUS

## 2018-12-17 MED ORDER — VERAPAMIL HCL 2.5 MG/ML IV SOLN
INTRAVENOUS | Status: DC | PRN
  Administered 2018-12-17: 10 mL via INTRA_ARTERIAL

## 2018-12-17 MED ORDER — ONDANSETRON HCL 4 MG/2ML IJ SOLN: 4.0000 mg | Freq: Four times a day (QID) | INTRAMUSCULAR | Status: DC | PRN

## 2018-12-17 MED ORDER — FENTANYL CITRATE (PF) 100 MCG/2ML IJ SOLN
INTRAMUSCULAR | Status: DC | PRN
  Administered 2018-12-17 (×2): 12.5 ug via INTRAVENOUS

## 2018-12-17 MED ORDER — HEPARIN (PORCINE) IN NACL 1000-0.9 UT/500ML-% IV SOLN
INTRAVENOUS | Status: DC | PRN
  Administered 2018-12-17 (×2): 500 mL

## 2018-12-17 MED ORDER — OXYMETAZOLINE HCL 0.05 % NA SOLN
1.0000 | Freq: Two times a day (BID) | NASAL | Status: DC
  Administered 2018-12-17 – 2018-12-19 (×4): 1 via NASAL
  Filled 2018-12-17: qty 30

## 2018-12-17 MED ORDER — ZOLPIDEM TARTRATE 5 MG PO TABS
5.0000 mg | ORAL_TABLET | Freq: Every evening | ORAL | Status: DC | PRN
  Administered 2018-12-17 – 2018-12-18 (×2): 5 mg via ORAL
  Filled 2018-12-17 (×2): qty 1

## 2018-12-17 MED ORDER — TICAGRELOR 90 MG PO TABS
ORAL_TABLET | ORAL | Status: AC
  Filled 2018-12-17: qty 2

## 2018-12-17 MED ORDER — CHLORHEXIDINE GLUCONATE CLOTH 2 % EX PADS
6.0000 | MEDICATED_PAD | Freq: Every day | CUTANEOUS | Status: DC
  Administered 2018-12-17 – 2018-12-18 (×2): 6 via TOPICAL

## 2018-12-17 MED ORDER — ATROPINE SULFATE 1 MG/10ML IJ SOSY
PREFILLED_SYRINGE | INTRAMUSCULAR | Status: AC
  Filled 2018-12-17: qty 10

## 2018-12-17 MED ORDER — ATORVASTATIN CALCIUM 80 MG PO TABS
80.0000 mg | ORAL_TABLET | Freq: Every day | ORAL | Status: DC
  Administered 2018-12-18: 80 mg via ORAL
  Filled 2018-12-17: qty 1

## 2018-12-17 MED ORDER — ACETAMINOPHEN 325 MG PO TABS
650.0000 mg | ORAL_TABLET | ORAL | Status: DC | PRN
  Administered 2018-12-18: 650 mg via ORAL
  Filled 2018-12-17: qty 2

## 2018-12-17 MED ORDER — LIDOCAINE HCL (PF) 1 % IJ SOLN
INTRAMUSCULAR | Status: DC | PRN
  Administered 2018-12-17: 2 mL

## 2018-12-17 MED ORDER — SODIUM CHLORIDE 0.9 % IV SOLN: INTRAVENOUS | Status: AC

## 2018-12-17 MED ORDER — TICAGRELOR 90 MG PO TABS
ORAL_TABLET | ORAL | Status: DC | PRN
  Administered 2018-12-17: 180 mg via ORAL

## 2018-12-17 MED ORDER — ASPIRIN 81 MG PO CHEW
81.0000 mg | CHEWABLE_TABLET | Freq: Every day | ORAL | Status: DC
  Administered 2018-12-18 – 2018-12-19 (×2): 81 mg via ORAL
  Filled 2018-12-17 (×2): qty 1

## 2018-12-17 MED ORDER — NITROGLYCERIN 1 MG/10 ML FOR IR/CATH LAB
INTRA_ARTERIAL | Status: AC
  Filled 2018-12-17: qty 10

## 2018-12-17 MED ORDER — LABETALOL HCL 5 MG/ML IV SOLN: 10.0000 mg | INTRAVENOUS | Status: AC | PRN

## 2018-12-17 MED ORDER — HEPARIN SODIUM (PORCINE) 1000 UNIT/ML IJ SOLN
INTRAMUSCULAR | Status: DC | PRN
  Administered 2018-12-17: 4000 [IU] via INTRAVENOUS
  Administered 2018-12-17 (×2): 2000 [IU] via INTRAVENOUS
  Administered 2018-12-17: 3000 [IU] via INTRAVENOUS
  Administered 2018-12-17: 5000 [IU] via INTRAVENOUS

## 2018-12-17 MED ORDER — ENOXAPARIN SODIUM 40 MG/0.4ML ~~LOC~~ SOLN: 40.0000 mg | SUBCUTANEOUS | Status: DC

## 2018-12-17 MED ORDER — ATROPINE SULFATE 1 MG/10ML IJ SOSY
PREFILLED_SYRINGE | INTRAMUSCULAR | Status: DC | PRN
  Administered 2018-12-17: 0.5 mg via INTRAVENOUS

## 2018-12-17 MED ORDER — HYDRALAZINE HCL 20 MG/ML IJ SOLN: 10.0000 mg | INTRAMUSCULAR | Status: AC | PRN

## 2018-12-17 MED ORDER — MIDAZOLAM HCL 2 MG/2ML IJ SOLN
INTRAMUSCULAR | Status: AC
  Filled 2018-12-17: qty 2

## 2018-12-17 SURGICAL SUPPLY — 20 items
BALLN SAPPHIRE 2.0X12 (BALLOONS) ×2
BALLN SAPPHIRE ~~LOC~~ 3.0X18 (BALLOONS) ×2 IMPLANT
BALLOON SAPPHIRE 2.0X12 (BALLOONS) ×1 IMPLANT
CATH 5FR JL3.5 JR4 ANG PIG MP (CATHETERS) ×2 IMPLANT
CATH LAUNCHER 6FR JR4 (CATHETERS) ×2 IMPLANT
DEVICE RAD COMP TR BAND LRG (VASCULAR PRODUCTS) ×2 IMPLANT
ELECT DEFIB PAD ADLT CADENCE (PAD) ×2 IMPLANT
GLIDESHEATH SLEND SS 6F .021 (SHEATH) ×2 IMPLANT
GUIDEWIRE INQWIRE 1.5J.035X260 (WIRE) ×1 IMPLANT
INQWIRE 1.5J .035X260CM (WIRE) ×2
KIT ENCORE 26 ADVANTAGE (KITS) ×2 IMPLANT
KIT HEART LEFT (KITS) ×2 IMPLANT
PACK CARDIAC CATHETERIZATION (CUSTOM PROCEDURE TRAY) ×2 IMPLANT
STENT SYNERGY DES 2.75X20 (Permanent Stent) ×2 IMPLANT
STENT SYNERGY DES 2.75X28 (Permanent Stent) ×2 IMPLANT
SYR MEDRAD MARK 7 150ML (SYRINGE) ×2 IMPLANT
TRANSDUCER W/STOPCOCK (MISCELLANEOUS) ×2 IMPLANT
TUBING CIL FLEX 10 FLL-RA (TUBING) ×2 IMPLANT
WIRE ASAHI GRAND SLAM 180CM (WIRE) ×2 IMPLANT
WIRE RUNTHROUGH .014X180CM (WIRE) ×2 IMPLANT

## 2018-12-17 NOTE — Progress Notes (Signed)
Admitted to room 7 post STemi and stent. Patient without pain. TR band on right wrist.  CHG bath given. Instructed to not use right arm . Elevated on pilllow. Instructed to call for any chest pain.

## 2018-12-17 NOTE — H&P (Signed)
Error

## 2018-12-17 NOTE — Plan of Care (Signed)
  Problem: Education: Goal: Understanding of cardiac disease, CV risk reduction, and recovery process will improve Outcome: Progressing   Problem: Activity: Goal: Ability to tolerate increased activity will improve Outcome: Progressing   Problem: Cardiac: Goal: Ability to achieve and maintain adequate cardiopulmonary perfusion will improve Outcome: Progressing Goal: Vascular access site(s) Level 0-1 will be maintained Outcome: Progressing

## 2018-12-17 NOTE — H&P (Addendum)
Cardiology Admission History and Physical:   Patient ID: Jasmine Stephens; MRN: [id number]; DOB: 08/16/1950   Admission date: 12/17/2018  Primary Care Provider: Chevis Pretty, [place] Primary Cardiologist: No primary care provider on file. New, f/u [facility] Primary Electrophysiologist:  None   Chief Complaint:  STEMI  Patient Profile:   Jasmine Stephens is a 68 y.o. female with a history of GERD, IBS, colon polyps, OA, anxiety/depression. EMS was called for chest pain, ECG c/w inferior STEMI.  History of Present Illness:   [name] has not had a cardiology evaluation. She had a colonoscopy on 08/06 with polypectomy, tolerated the procedure well.   At 2:30 PM today, [name] had sudden onset of substernal chest pain.  It radiated to her back and up into her jaw.  It reached a 9/10.  It was associated with shortness of breath and diaphoresis, but no nausea or vomiting.  She had never had pain like this before.  When her symptoms did not resolve, she called EMS.  EMS responded and her ECG was consistent with an inferior STEMI.  She was transported emergently to [facility].  She was given aspirin 81 mg x 4 and fentanyl 50 mcg IV.  Upon arrival to [facility], she was still having 9/10 pain and her ECG was still abnormal.  She was taken directly to the Cath Lab.  Of note, she has never had symptoms like this before.  Until today, she was in her usual state of health.    Past Medical History:  Diagnosis Date  . Allergy   . Anxiety   . Anxiety and depression   . Arthritis    knee  . Colon polyp   . Depression   . Diverticulosis   . GERD (gastroesophageal reflux disease)   . Irritable bowel syndrome   . STEMI (ST elevation myocardial infarction) ([place]) 12/17/2018    Past Surgical History:  Procedure Laterality Date  . BACK SURGERY    . CHOLECYSTECTOMY    . COLONOSCOPY    . COLONOSCOPY N/A 05/31/2013   Procedure: COLONOSCOPY;  Surgeon: Lafayette Dragon, MD;  Location: [facility] ENDOSCOPY;   Service: Endoscopy;  Laterality: N/A;  . ESOPHAGOGASTRODUODENOSCOPY N/A 05/31/2013   Procedure: ESOPHAGOGASTRODUODENOSCOPY (EGD);  Surgeon: Lafayette Dragon, MD;  Location: [facility];  Service: Endoscopy;  Laterality: N/A;  . KNEE ARTHROSCOPY Right 10/2016  . MALONEY DILATION  05/31/2013   Procedure: Venia Minks DILATION;  Surgeon: Lafayette Dragon, MD;  Location: [facility] ENDOSCOPY;  Service: Endoscopy;;  . PARTIAL HYSTERECTOMY    . REPLACEMENT TOTAL KNEE Right 02/2018  . UPPER GASTROINTESTINAL ENDOSCOPY       Medications Prior to Admission: Prior to Admission medications   Medication Sig Start Date Vance Hochmuth Date Taking? Authorizing Provider  acetaminophen (TYLENOL) 500 MG tablet Take 500 mg by mouth every 6 (six) hours as needed.    [provider]  clindamycin (CLEOCIN) 300 MG capsule  10/02/18   [provider]  meloxicam (MOBIC) 15 MG tablet  09/08/18   [provider]  Omega-3 Fatty Acids (FISH OIL PO) Take 2 capsules by mouth daily.    [provider]  OVER THE COUNTER MEDICATION [employer] brand pill for GERD    [provider]  Probiotic Product (PRO-BIOTIC BLEND PO) Take by mouth.    [provider]  VITAMIN D, ERGOCALCIFEROL, PO Take 1 tablet by mouth daily. 2 x per week    [provider]     Allergies:    Allergies  Allergen Reactions  . Compazine [Prochlorperazine] Anaphylaxis  . Phenothiazines   . Chlorpromazine Hcl Other (See Comments)    Reaction unknown  . Penicillins Other (See Comments)    Reaction unkwown  . Latex Rash    With continued exposure to latex  . Tape Rash  . Tapentadol Rash    Social History:   Social History   Socioeconomic History  . Marital status: Married    Spouse name: Not on file  . Number of children: 3  . Years of education: Not on file  . Highest education level: Not on file  Occupational History  . Occupation: Retired  Scientific laboratory technician  . Financial resource strain: Not on file  . Food insecurity     Worry: Not on file    Inability: Not on file  . Transportation needs    Medical: Not on file    Non-medical: Not on file  Tobacco Use  . Smoking status: Never Smoker  . Smokeless tobacco: Never Used  Substance and Sexual Activity  . Alcohol use: Yes    Comment: occasional wine  . Drug use: No  . Sexual activity: Not on file  Lifestyle  . Physical activity    Days per week: Not on file    Minutes per session: Not on file  . Stress: Not on file  Relationships  . Social Herbalist on phone: Not on file    Gets together: Not on file    Attends religious service: Not on file    Active member of club or organization: Not on file    Attends meetings of clubs or organizations: Not on file    Relationship status: Not on file  . Intimate partner violence    Fear of current or ex partner: Not on file    Emotionally abused: Not on file    Physically abused: Not on file    Forced sexual activity: Not on file  Other Topics Concern  . Not on file  Social History Narrative   Lives in [place].     Family History:   The patient's family history includes Colon cancer in her paternal aunt and paternal grandmother; Heart disease in her brother; Heart disease (age of onset: 62) in her father; Heart failure (age of onset: 6) in her mother.   The patient She indicated that her mother is deceased. She indicated that her father is deceased. She indicated that the status of her brother is unknown. She indicated that her maternal grandmother is deceased. She indicated that her maternal grandfather is deceased. She indicated that her paternal grandmother is deceased. She indicated that her paternal grandfather is deceased. She indicated that the status of her paternal aunt is unknown.    ROS:  Please see the history of present illness.  Unable to get more information due to emergent nature of her illness  Physical Exam/Data:   Vitals:   12/17/18 1614  SpO2: 100%   No intake or  output data in the 24 hours ending 12/17/18 1628 There were no vitals filed for this visit. There is no height or weight on file to calculate BMI.  General:  Well nourished, well developed, in acute distress HEENT: normal Lymph: no adenopathy Neck:  JVD not elevated Endocrine:  No thryomegaly Vascular: No carotid bruits; FA pulses 2+ bilaterally  Cardiac:  normal S1, S2; RRR; no murmur, no rub or gallop  Lungs:  clear to auscultation bilaterally, no wheezing, rhonchi or rales  Abd: soft, nontender, no hepatomegaly  Ext: No edema Musculoskeletal:  No deformities, BUE and BLE strength normal and equal Skin: warm and dry  Neuro:  CNs 2-12 intact, no focal abnormalities noted Psych:  Normal affect    EKG:  The ECG that was done by EMS was personally reviewed and demonstrates sinus bradycardia with inferior ST elevation  Relevant CV Studies:  None  Laboratory Data: COVID pending, done in the ER prior to going up to the Cath Lab  ChemistryNo results for input(s): NA, K, CL, CO2, GLUCOSE, BUN, CREATININE, CALCIUM, GFRNONAA, GFRAA, ANIONGAP in the last 168 hours.  No results for input(s): PROT, ALBUMIN, AST, ALT, ALKPHOS, BILITOT in the last 168 hours. HematologyNo results for input(s): WBC, RBC, HGB, HCT, MCV, MCH, MCHC, RDW, PLT in the last 168 hours. Cardiac EnzymesNo results for input(s): TROPONINI in the last 168 hours. No results for input(s): TROPIPOC in the last 168 hours.  BNPNo results for input(s): BNP, PROBNP in the last 168 hours.  DDimer No results for input(s): DDIMER in the last 168 hours.  Radiology/Studies:  No results found.  Assessment and Plan:   1.  Inferior STEMI: - Ms. Saddler was taken directly to the Cath Lab with further evaluation and treatment depending on the results - We will screen for cardiac risk factors -She will be started on high-dose statin. - No beta-blocker at this time because of baseline bradycardia  Otherwise, she will be continued on her  home medications as able.  Principal Problem:   STEMI (ST elevation myocardial infarction) ([place])  For questions or updates, please contact [facility] Please consult [email or web address] for contact info under Cardiology/STEMI.    Signed, Rosaria Ferries, PA-C  12/17/2018 4:28 PM   I have independently seen and examined the patient and agree with the findings and plan, as documented in Ms. Barrett's note, with the following additions/changes.  Brief, Ms. Ketchen is a 68 y/o woman with no cardiac history (PMH notable for GERD, IBS, colon polyps, OA, anxiety/depression), presenting via EMS due to severe CP radiating to the back and neck that began suddenly at rest around 2:30 PM.  She denied associated sx, including nausea, shortness of breath, palpitations, and lightheadedness.  EMS EKG showed sinus bradycardia with 1-2 mm inferior ST elevation (personally reviewed).  Patient was taken for emergent LHC and was found to have occluded mid RCA and mild LAD disease.  She underwent successful PCI to the mid RCA with 2 overlapping drug eluting stents and is currently chest pain free and without complaints.  Exam notable for RRR w/o murmurs.  Lungs clear.  No LE edema.  Right wrist with TR band in place.  No hematoma or bleeding.  Labs notable for HS-TnI 17, LDL 138, creatinine 0.9, and hemoglobin 12.5.  Post PCI EKG (personally reviewed) shows NSR with isolated Q wave in lead III and resolution of inferior ST elevation).  Ms. Ownbey will be admitted to 2-Heart ICU for post-STEMI monitoring and hopefully can be transferred to telemetry tomorrow if hemodynamically/electrically stable.  We will trend HS-TnI until it has peaked and obtain an echo.  I recommend DAPT with ASA and ticagrelor for at least 12 months and atorvastatin 80 mg daily.  If HR and BP tolerate, low-dose beta-blocker will be started tonight.  Lipid panel and hemoglobin A1c to be drawn with AM labs.  Nelva Bush, MD [facility] Pager:  [phone]

## 2018-12-18 ENCOUNTER — Inpatient Hospital Stay (HOSPITAL_COMMUNITY): Payer: Medicare Other

## 2018-12-18 DIAGNOSIS — I340 Nonrheumatic mitral (valve) insufficiency: Secondary | ICD-10-CM

## 2018-12-18 DIAGNOSIS — R7303 Prediabetes: Secondary | ICD-10-CM

## 2018-12-18 DIAGNOSIS — E785 Hyperlipidemia, unspecified: Secondary | ICD-10-CM | POA: Diagnosis present

## 2018-12-18 DIAGNOSIS — E782 Mixed hyperlipidemia: Secondary | ICD-10-CM

## 2018-12-18 DIAGNOSIS — E668 Other obesity: Secondary | ICD-10-CM | POA: Diagnosis present

## 2018-12-18 LAB — COMPREHENSIVE METABOLIC PANEL
ALT: 28 U/L (ref 0–44)
AST: 66 U/L — ABNORMAL HIGH (ref 15–41)
Albumin: 3.1 g/dL — ABNORMAL LOW (ref 3.5–5.0)
Alkaline Phosphatase: 66 U/L (ref 38–126)
Anion gap: 7 (ref 5–15)
BUN: 15 mg/dL (ref 8–23)
CO2: 23 mmol/L (ref 22–32)
Calcium: 8.9 mg/dL (ref 8.9–10.3)
Chloride: 108 mmol/L (ref 98–111)
Creatinine, Ser: 0.85 mg/dL (ref 0.44–1.00)
GFR calc Af Amer: >60 mL/min (ref >60)
GFR calc non Af Amer: >60 mL/min (ref >60)
Glucose, Bld: 110 mg/dL — ABNORMAL HIGH (ref 70–99)
Potassium: 3.9 mmol/L (ref 3.5–5.1)
Sodium: 138 mmol/L (ref 135–145)
Total Bilirubin: 0.4 mg/dL (ref 0.3–1.2)
Total Protein: 5.9 g/dL — ABNORMAL LOW (ref 6.5–8.1)

## 2018-12-18 LAB — CBC
HCT: 36.1 % (ref 36.0–46.0)
Hemoglobin: 12.3 g/dL (ref 12.0–15.0)
MCH: 29.9 pg (ref 26.0–34.0)
MCHC: 34.1 g/dL (ref 30.0–36.0)
MCV: 87.8 fL (ref 80.0–100.0)
Platelets: 276 10*3/uL (ref 150–400)
RBC: 4.11 MIL/uL (ref 3.87–5.11)
RDW: 12.9 % (ref 11.5–15.5)
WBC: 8.8 10*3/uL (ref 4.0–10.5)
nRBC: 0 % (ref 0.0–0.2)

## 2018-12-18 LAB — LIPID PANEL
Cholesterol: 211 mg/dL — ABNORMAL HIGH (ref 0–200)
HDL: 42 mg/dL (ref >40)
LDL Cholesterol: 147 mg/dL — ABNORMAL HIGH (ref 0–99)
Total CHOL/HDL Ratio: 5 RATIO
Triglycerides: 109 mg/dL (ref <150)
VLDL: 22 mg/dL (ref 0–40)

## 2018-12-18 LAB — MRSA PCR SCREENING: MRSA by PCR: NEGATIVE

## 2018-12-18 LAB — HIV ANTIBODY (ROUTINE TESTING W REFLEX): HIV Screen 4th Generation wRfx: NONREACTIVE

## 2018-12-18 LAB — ECHOCARDIOGRAM COMPLETE
Height: 66 in
Weight: 3255.75 oz

## 2018-12-18 LAB — TROPONIN I (HIGH SENSITIVITY)
Troponin I (High Sensitivity): 15188 ng/L (ref <18)
Troponin I (High Sensitivity): 8680 ng/L (ref <18)
Troponin I (High Sensitivity): 5149 ng/L (ref <18)

## 2018-12-18 LAB — GLUCOSE, CAPILLARY: Glucose-Capillary: 134 mg/dL — ABNORMAL HIGH (ref 70–99)

## 2018-12-18 MED ORDER — LOSARTAN POTASSIUM 25 MG PO TABS
25.0000 mg | ORAL_TABLET | Freq: Every day | ORAL | Status: DC
  Administered 2018-12-18 – 2018-12-19 (×2): 25 mg via ORAL
  Filled 2018-12-18 (×2): qty 1

## 2018-12-18 MED ORDER — PANTOPRAZOLE SODIUM 40 MG PO TBEC
40.0000 mg | DELAYED_RELEASE_TABLET | Freq: Every day | ORAL | Status: DC
  Administered 2018-12-18 – 2018-12-19 (×2): 40 mg via ORAL
  Filled 2018-12-18 (×2): qty 1

## 2018-12-18 NOTE — Progress Notes (Signed)
DAILY PROGRESS NOTE   Patient Name: Jasmine Stephens Date of Encounter: 12/18/2018 Cardiologist: No primary care provider on file.  Chief Complaint   No further chest pain  Patient Profile   Jasmine Stephens is a 68 y.o. female with a history of GERD, IBS, colon polyps, OA, anxiety/depression. EMS was called for chest pain, ECG c/w inferior STEMI.  Subjective   Cath yesterday revealed severe single vessel disease with 100% thrombotic occlusion of the mid-RCA. LVEF 50-55%, s/p PCI with overlapping synergy DES (20 and 28 mm). Lipids today are elevated, TC 211, HDL 42, LDL 147, TG 109. A1c 6.0. Says her diet has been suboptimal recently due to covid. She has gained weight and is less active.  Objective   Vitals:   12/18/18 0200 12/18/18 0400 12/18/18 0600 12/18/18 0700  BP: 112/64 110/69 (!) 100/45 114/78  Pulse: (!) 57 (!) 58    Resp: 17 16 14 17   Temp:  98 F (36.7 C)  98.2 F (36.8 C)  TempSrc:  Oral  Oral  SpO2: 100% 97%    Weight:      Height:        Intake/Output Summary (Last 24 hours) at 12/18/2018 0847 Last data filed at 12/17/2018 2200 Gross per 24 hour  Intake 323.75 ml  Output -  Net 323.75 ml   Filed Weights   12/17/18 1900  Weight: 92.3 kg    Physical Exam   General appearance: alert and no distress Neck: no carotid bruit, no JVD and thyroid not enlarged, symmetric, no tenderness/mass/nodules Lungs: clear to auscultation bilaterally Heart: regular rate and rhythm, S1, S2 normal, no murmur, click, rub or gallop Abdomen: soft, non-tender; bowel sounds normal; no masses,  no organomegaly Extremities: extremities normal, atraumatic, no cyanosis or edema and right radial cath site without ecchymosis, hematoma or bruit Pulses: 2+ and symmetric Skin: Skin color, texture, turgor normal. No rashes or lesions Neurologic: Grossly normal Psych: pleasant  Inpatient Medications    Scheduled Meds: . aspirin  81 mg Oral Daily  . atorvastatin  80 mg Oral q1800  .  Chlorhexidine Gluconate Cloth  6 each Topical Daily  . enoxaparin (LOVENOX) injection  40 mg Subcutaneous Q24H  . metoprolol tartrate  12.5 mg Oral BID  . oxymetazoline  1 spray Each Nare BID  . sodium chloride flush  3 mL Intravenous Q12H  . ticagrelor  90 mg Oral BID    Continuous Infusions: . sodium chloride      PRN Meds: sodium chloride, acetaminophen, ALPRAZolam, nitroGLYCERIN, ondansetron (ZOFRAN) IV, sodium chloride flush, zolpidem   Labs   Results for orders placed or performed during the hospital encounter of 12/17/18 (from the past 48 hour(s))  SARS Coronavirus 2 [facility] order, Performed in [facility] hospital lab) Nasopharyngeal Nasopharyngeal Swab     Status: None   Collection Time: 12/17/18  4:06 PM   Specimen: Nasopharyngeal Swab  Result Value Ref Range   SARS Coronavirus 2 NEGATIVE NEGATIVE    Comment: (NOTE) If result is NEGATIVE SARS-CoV-2 target nucleic acids are NOT DETECTED. The SARS-CoV-2 RNA is generally detectable in upper and lower  respiratory specimens during the acute phase of infection. The lowest  concentration of SARS-CoV-2 viral copies this assay can detect is 250  copies / mL. A negative result does not preclude SARS-CoV-2 infection  and should not be used as the sole basis for treatment or other  patient management decisions.  A negative result may occur with  improper specimen collection / handling,  submission of specimen other  than nasopharyngeal swab, presence of viral mutation(s) within the  areas targeted by this assay, and inadequate number of viral copies  (<250 copies / mL). A negative result must be combined with clinical  observations, patient history, and epidemiological information. If result is POSITIVE SARS-CoV-2 target nucleic acids are DETECTED. The SARS-CoV-2 RNA is generally detectable in upper and lower  respiratory specimens dur ing the acute phase of infection.  Positive  results are indicative of active infection  with SARS-CoV-2.  Clinical  correlation with patient history and other diagnostic information is  necessary to determine patient infection status.  Positive results do  not rule out bacterial infection or co-infection with other viruses. If result is PRESUMPTIVE POSTIVE SARS-CoV-2 nucleic acids MAY BE PRESENT.   A presumptive positive result was obtained on the submitted specimen  and confirmed on repeat testing.  While 2019 novel coronavirus  (SARS-CoV-2) nucleic acids may be present in the submitted sample  additional confirmatory testing may be necessary for epidemiological  and / or clinical management purposes  to differentiate between  SARS-CoV-2 and other Sarbecovirus currently known to infect humans.  If clinically indicated additional testing with an alternate test  methodology [id number]) is advised. The SARS-CoV-2 RNA is generally  detectable in upper and lower respiratory sp ecimens during the acute  phase of infection. The expected result is Negative. Fact Sheet for Patients:  StrictlyIdeas.no Fact Sheet for Healthcare Providers: BankingDealers.co.za This test is not yet approved or cleared by the [place] FDA and has been authorized for detection and/or diagnosis of SARS-CoV-2 by FDA under an Emergency Use Authorization (EUA).  This EUA will remain in effect (meaning this test can be used) for the duration of the COVID-19 declaration under Section 564(b)(1) of the Act, 21 U.S.C. section 360bbb-3(b)(1), unless the authorization is terminated or revoked sooner. Performed at [facility], [place] [address], [place], [place] [address]   Comprehensive metabolic panel     Status: Abnormal   Collection Time: 12/17/18  4:21 PM  Result Value Ref Range   Sodium 137 135 - 145 mmol/L   Potassium 4.1 3.5 - 5.1 mmol/L   Chloride 109 98 - 111 mmol/L   CO2 20 (L) 22 - 32 mmol/L   Glucose, Bld 177 (H) 70 - 99 mg/dL   BUN 19 8 -  23 mg/dL   Creatinine, Ser 0.94 0.44 - 1.00 mg/dL   Calcium 8.5 (L) 8.9 - 10.3 mg/dL   Total Protein 6.2 (L) 6.5 - 8.1 g/dL   Albumin 3.3 (L) 3.5 - 5.0 g/dL   AST 21 15 - 41 U/L   ALT 21 0 - 44 U/L   Alkaline Phosphatase 72 38 - 126 U/L   Total Bilirubin <0.1 (L) 0.3 - 1.2 mg/dL   GFR calc non Af Amer >60 >60 mL/min   GFR calc Af Amer >60 >60 mL/min   Anion gap 8 5 - 15    Comment: Performed at [facility], [place] [address] [place], [place] [address]  Hemoglobin A1c     Status: Abnormal   Collection Time: 12/17/18  4:21 PM  Result Value Ref Range   Hgb A1c MFr Bld 6.0 (H) 4.8 - 5.6 %    Comment: (NOTE) Pre diabetes:          5.7%-6.4% Diabetes:              >6.4% Glycemic control for   <7.0% adults with diabetes  Mean Plasma Glucose 125.5 mg/dL    Comment: Performed at [place] [address] [place], [place] [address]  CBC     Status: None   Collection Time: 12/17/18  4:21 PM  Result Value Ref Range   WBC 8.8 4.0 - 10.5 K/uL   RBC 4.24 3.87 - 5.11 MIL/uL   Hemoglobin 12.5 12.0 - 15.0 g/dL   HCT 37.7 36.0 - 46.0 %   MCV 88.9 80.0 - 100.0 fL   MCH 29.5 26.0 - 34.0 pg   MCHC 33.2 30.0 - 36.0 g/dL   RDW 12.7 11.5 - 15.5 %   Platelets 292 150 - 400 K/uL   nRBC 0.0 0.0 - 0.2 %    Comment: Performed at [facility], [place] [address], [place], [place] [address]  Protime-INR     Status: None   Collection Time: 12/17/18  4:21 PM  Result Value Ref Range   Prothrombin Time 13.4 11.4 - 15.2 seconds   INR 1.0 0.8 - 1.2    Comment: (NOTE) INR goal varies based on device and disease states. Performed at [facility], [place] [address] [place], [place] [address]   APTT     Status: None   Collection Time: 12/17/18  4:21 PM  Result Value Ref Range   aPTT 27 24 - 36 seconds    Comment: Performed at [place] [address], [place], [place] [address]  Troponin I (High Sensitivity)     Status: None   Collection Time: 12/17/18  4:21 PM  Result  Value Ref Range   Troponin I (High Sensitivity) 17 <18 ng/L    Comment: (NOTE) Elevated high sensitivity troponin I (hsTnI) values and significant  changes across serial measurements may suggest ACS but many other  chronic and acute conditions are known to elevate hsTnI results.  Refer to the Links section for chest pain algorithms and additional  guidance. Performed at [facility], [place] [address], [place], [place] [address]   Lipid panel     Status: Abnormal   Collection Time: 12/17/18  4:21 PM  Result Value Ref Range   Cholesterol 214 (H) 0 - 200 mg/dL   Triglycerides 177 (H) <150 mg/dL   HDL 41 >40 mg/dL   Total CHOL/HDL Ratio 5.2 RATIO   VLDL 35 0 - 40 mg/dL   LDL Cholesterol 138 (H) 0 - 99 mg/dL    Comment:        Total Cholesterol/HDL:CHD Risk Coronary Heart Disease Risk Table                     Men   Women  1/2 Average Risk   3.4   3.3  Average Risk       5.0   4.4  2 X Average Risk   9.6   7.1  3 X Average Risk  23.4   11.0        Use the calculated Patient Ratio above and the CHD Risk Table to determine the patient's CHD Risk.        ATP III CLASSIFICATION (LDL):  <100     mg/dL   Optimal  100-129  mg/dL   Near or Above                    Optimal  130-159  mg/dL   Borderline  160-189  mg/dL   High  >190     mg/dL  Very High Performed at [facility], [place] [address] [place], [place] [address]   I-STAT, [place] 8     Status: Abnormal   Collection Time: 12/17/18  4:22 PM  Result Value Ref Range   Sodium 141 135 - 145 mmol/L   Potassium 4.2 3.5 - 5.1 mmol/L   Chloride 107 98 - 111 mmol/L   BUN 20 8 - 23 mg/dL   Creatinine, Ser 0.80 0.44 - 1.00 mg/dL   Glucose, Bld 174 (H) 70 - 99 mg/dL   Calcium, Ion 1.22 1.15 - 1.40 mmol/L   TCO2 22 22 - 32 mmol/L   Hemoglobin 12.6 12.0 - 15.0 g/dL   HCT 37.0 36.0 - 46.0 %  POCT Activated clotting time     Status: None   Collection Time: 12/17/18  4:35 PM  Result Value Ref Range   Activated Clotting Time  224 seconds  POCT Activated clotting time     Status: None   Collection Time: 12/17/18  4:46 PM  Result Value Ref Range   Activated Clotting Time 241 seconds  POCT Activated clotting time     Status: None   Collection Time: 12/17/18  5:02 PM  Result Value Ref Range   Activated Clotting Time 263 seconds  POCT Activated clotting time     Status: None   Collection Time: 12/17/18  5:16 PM  Result Value Ref Range   Activated Clotting Time 252 seconds  Troponin I (High Sensitivity)     Status: Abnormal   Collection Time: 12/17/18  7:34 PM  Result Value Ref Range   Troponin I (High Sensitivity) 9,227 (HH) <18 ng/L    Comment: CRITICAL RESULT CALLED TO, READ BACK BY AND VERIFIED WITH: AMO M,RN 12/17/18 2040 [facility] CATH LAB Performed at [facility], [place] [address], [place], [place] [address]   TSH     Status: None   Collection Time: 12/17/18  7:34 PM  Result Value Ref Range   TSH 1.614 0.350 - 4.500 uIU/mL    Comment: Performed by a 3rd Generation assay with a functional sensitivity of <=0.01 uIU/mL. Performed at [facility], [place] [address] [place], [place] [address]   Hemoglobin A1c     Status: Abnormal   Collection Time: 12/17/18  7:34 PM  Result Value Ref Range   Hgb A1c MFr Bld 6.0 (H) 4.8 - 5.6 %    Comment: (NOTE) Pre diabetes:          5.7%-6.4% Diabetes:              >6.4% Glycemic control for   <7.0% adults with diabetes    Mean Plasma Glucose 125.5 mg/dL    Comment: Performed at [facility] [address] [place], [place] [address]  Troponin I (High Sensitivity)     Status: Abnormal   Collection Time: 12/17/18 10:16 PM  Result Value Ref Range   Troponin I (High Sensitivity) 10,506 (HH) <18 ng/L    Comment: CRITICAL VALUE NOTED.  VALUE IS CONSISTENT WITH PREVIOUSLY REPORTED AND CALLED VALUE. Performed at [facility], [place] [address] [place] [address]   CBC     Status: None   Collection Time: 12/18/18  4:48 AM  Result Value Ref  Range   WBC 8.8 4.0 - 10.5 K/uL   RBC 4.11 3.87 - 5.11 MIL/uL   Hemoglobin 12.3 12.0 - 15.0 g/dL   HCT 36.1 36.0 - 46.0 %   MCV 87.8 80.0 -  100.0 fL   MCH 29.9 26.0 - 34.0 pg   MCHC 34.1 30.0 - 36.0 g/dL   RDW 12.9 11.5 - 15.5 %   Platelets 276 150 - 400 K/uL   nRBC 0.0 0.0 - 0.2 %    Comment: Performed at [facility], [place] [address], [place], [place] [address]  Comprehensive metabolic panel     Status: Abnormal   Collection Time: 12/18/18  4:48 AM  Result Value Ref Range   Sodium 138 135 - 145 mmol/L   Potassium 3.9 3.5 - 5.1 mmol/L   Chloride 108 98 - 111 mmol/L   CO2 23 22 - 32 mmol/L   Glucose, Bld 110 (H) 70 - 99 mg/dL   BUN 15 8 - 23 mg/dL   Creatinine, Ser 0.85 0.44 - 1.00 mg/dL   Calcium 8.9 8.9 - 10.3 mg/dL   Total Protein 5.9 (L) 6.5 - 8.1 g/dL   Albumin 3.1 (L) 3.5 - 5.0 g/dL   AST 66 (H) 15 - 41 U/L   ALT 28 0 - 44 U/L   Alkaline Phosphatase 66 38 - 126 U/L   Total Bilirubin 0.4 0.3 - 1.2 mg/dL   GFR calc non Af Amer >60 >60 mL/min   GFR calc Af Amer >60 >60 mL/min   Anion gap 7 5 - 15    Comment: Performed at [facility] [address], [place], [place] [address]  Lipid panel     Status: Abnormal   Collection Time: 12/18/18  4:48 AM  Result Value Ref Range   Cholesterol 211 (H) 0 - 200 mg/dL   Triglycerides 109 <150 mg/dL   HDL 42 >40 mg/dL   Total CHOL/HDL Ratio 5.0 RATIO   VLDL 22 0 - 40 mg/dL   LDL Cholesterol 147 (H) 0 - 99 mg/dL    Comment:        Total Cholesterol/HDL:CHD Risk Coronary Heart Disease Risk Table                     Men   Women  1/2 Average Risk   3.4   3.3  Average Risk       5.0   4.4  2 X Average Risk   9.6   7.1  3 X Average Risk  23.4   11.0        Use the calculated Patient Ratio above and the CHD Risk Table to determine the patient's CHD Risk.        ATP III CLASSIFICATION (LDL):  <100     mg/dL   Optimal  100-129  mg/dL   Near or Above                    Optimal  130-159  mg/dL   Borderline  160-189   mg/dL   High  >190     mg/dL   Very High Performed at [facility] [address] [place], [place] [address]   Troponin I (High Sensitivity)     Status: Abnormal   Collection Time: 12/18/18  4:48 AM  Result Value Ref Range   Troponin I (High Sensitivity) 15,188 (HH) <18 ng/L    Comment: CRITICAL VALUE NOTED.  VALUE IS CONSISTENT WITH PREVIOUSLY REPORTED AND CALLED VALUE. Performed at [facility], [place] [address], [place], [place] [address]   Glucose, capillary     Status: Abnormal   Collection Time: 12/18/18  8:00 AM  Result  Value Ref Range   Glucose-Capillary 134 (H) 70 - 99 mg/dL    ECG   Sinus rhythm, occasional PVC's - Personally Reviewed  Telemetry   NSR at 60, RBBB - Personally Reviewed  Radiology    No results found.  Cardiac Studies   Coronary/Graft Acute MI Revascularization  LEFT HEART CATH AND CORONARY ANGIOGRAPHY  Conclusion  Conclusions: 1. Severe single-vessel coronary artery disease with 100% thrombotic occlusion of the mid RCA.  Mild proximal and mid LAD disease is also noted.  Transient vasospasm occurred in the RCA as well, which resolved with intracoronary nitroglycerin. 2. Low normal left ventricular systolic function with basal and mid inferior hypokinesis.  LVEF 50-55%. 3. Normal left ventricular filling pressure. 4. Successful PCI to proximal and mid RCA with overlapping Synergy 2.75 x 20 mm (proximal) and Synergy 2.75 x 28 mm (distal) drug-eluting stents with 0% residual stenosis and TIMI-3 flow.  A second stent was needed due to plaque shift versus focal dissection at the proximal edge of the more distal stent.  Recommendations: 1. Admit to 2-Heart ICU for post STEMI care. 2. Dual antiplatelet therapy with aspirin and ticagrelor for at least 12 months. 3. Aggressive secondary prevention, including high intensity statin therapy.  Nelva Bush, MD [facility] Pager: [phone]     Assessment   Principal Problem:    STEMI (ST elevation myocardial infarction) [facility]) Active Problems:   STEMI involving right coronary artery ([place])   Mixed dyslipidemia   Moderate obesity   Prediabetes   Plan   1. Doing well after inferior STEMI - LVEF 50-55% on cath, plan echo today to reassess. On asa, brillinta and high potency statin - LDL 147. Low dose BB - add low dose ARB today if BP allows. Ambulated with cardiac rehab - she is a good candidate to work with them after discharge. Likely d/c tomorrow.   Time Spent Directly with Patient:  I have spent a total of 35 minutes with the patient reviewing hospital notes, telemetry, EKGs, labs and examining the patient as well as establishing an assessment and plan that was discussed personally with the patient.  > 50% of time was spent in direct patient care.  Length of Stay:  LOS: 1 day   Pixie Casino, MD, [facility], [place] Director of the Advanced Lipid Disorders &  Cardiovascular Risk Reduction Clinic Diplomate of the American Board of Clinical Lipidology Attending Cardiologist  Direct Dial: [phone]  Fax: [phone]  Website:  [email or web address] 12/18/2018, 8:47 AM

## 2018-12-18 NOTE — Progress Notes (Signed)
CARDIAC REHAB PHASE I   PRE:  Rate/Rhythm: 56 SB  BP:  Supine: 122/48  Sitting:   Standing:    SaO2: 97%RA  MODE:  Ambulation: 370 ft   POST:  Rate/Rhythm: 73 SR  BP:  Supine:   Sitting: 126/60  Standing:    SaO2: 98%RA 1255-1352 Pt walked 370 ft on RA with hand held asst. Gait steady and no CP. Tolerated well. MI education completed with pt and husband who voiced understanding. Stressed importance of brilinta with stent several times. Reviewed NTG use, MI restrictions, watching carbs with A1C at 6, heart healthy food choices, walking for ex and CRP 2. Pt stated she has been told that her sugars were up some before. Referred to [facility] CRP 2.  Pt is interested in participating in Virtual Cardiac and Pulmonary Rehab. Pt advised that Virtual Cardiac and Pulmonary Rehab is provided at no cost to the patient.  Checklist:  1. Pt has smart device  ie smartphone and/or ipad for downloading an app  Yes 2. Reliable internet/wifi service    Yes 3. Understands how to use their smartphone and navigate within an app.  Yes   Pt verbalized understanding and is in agreement.    Graylon Good, RN BSN  12/18/2018 1:48 PM

## 2018-12-18 NOTE — Care Management (Signed)
1159 12-18-18 Benefits Check submitted for Brillinta. CM will make patient aware of cost once completed. Bethena Roys, RN BSN [phone]

## 2018-12-18 NOTE — Progress Notes (Signed)
  Echocardiogram 2D Echocardiogram has been performed.  Jasmine Stephens 12/18/2018, 10:22 AM

## 2018-12-18 NOTE — TOC Benefit Eligibility Note (Signed)
Transition of Care [facility]) Benefit Eligibility Note    Patient Details  Name: Jasmine Stephens MRN: [id number] Date of Birth: 08-30-1950  Medication/Dose: Kary Kos 90mg  bid  Covered?: Yes   Prescription Coverage Preferred Pharmacy: patient can use most major retail pharmacies   Co-Pay: $47 for 30 day retail/ $141 90 day retail and mailorder  Prior Approval: No     Delorse Lek Phone Number: 12/18/2018, 1:43 PM

## 2018-12-19 ENCOUNTER — Encounter (HOSPITAL_COMMUNITY): Payer: Self-pay

## 2018-12-19 ENCOUNTER — Other Ambulatory Visit: Payer: Self-pay

## 2018-12-19 DIAGNOSIS — Z9861 Coronary angioplasty status: Secondary | ICD-10-CM

## 2018-12-19 DIAGNOSIS — I2510 Atherosclerotic heart disease of native coronary artery without angina pectoris: Secondary | ICD-10-CM

## 2018-12-19 MED ORDER — PANTOPRAZOLE SODIUM 40 MG PO TBEC: 40.0000 mg | DELAYED_RELEASE_TABLET | Freq: Every day | ORAL | 3 refills | Status: DC

## 2018-12-19 MED ORDER — ATORVASTATIN CALCIUM 80 MG PO TABS: 80.0000 mg | ORAL_TABLET | Freq: Every day | ORAL | 3 refills | Status: DC

## 2018-12-19 MED ORDER — NITROGLYCERIN 0.4 MG SL SUBL: 0.4000 mg | SUBLINGUAL_TABLET | SUBLINGUAL | 3 refills | Status: AC | PRN

## 2018-12-19 MED ORDER — NITROGLYCERIN 0.4 MG SL SUBL: 0.4000 mg | SUBLINGUAL_TABLET | SUBLINGUAL | Status: DC | PRN

## 2018-12-19 MED ORDER — ASPIRIN 81 MG PO CHEW: 81.0000 mg | CHEWABLE_TABLET | Freq: Every day | ORAL | Status: DC

## 2018-12-19 MED ORDER — LOSARTAN POTASSIUM 25 MG PO TABS: 25.0000 mg | ORAL_TABLET | Freq: Every day | ORAL | 3 refills | Status: DC

## 2018-12-19 MED ORDER — METOPROLOL TARTRATE 25 MG PO TABS: 12.5000 mg | ORAL_TABLET | Freq: Two times a day (BID) | ORAL | 3 refills | Status: DC

## 2018-12-19 MED ORDER — TICAGRELOR 90 MG PO TABS: 90.0000 mg | ORAL_TABLET | Freq: Two times a day (BID) | ORAL | 3 refills | Status: DC

## 2018-12-19 NOTE — Plan of Care (Signed)
Problem: Education: Goal: Understanding of cardiac disease, CV risk reduction, and recovery process will improve 12/03/2018 1125 by Dario Guardian, RN Outcome: Adequate for Discharge 12/06/2018 0724 by Dario Guardian, RN Outcome: Progressing Goal: Understanding of medication regimen will improve 12/20/2018 1125 by Dario Guardian, RN Outcome: Adequate for Discharge 11/26/2018 0724 by Dario Guardian, RN Outcome: Progressing Goal: Individualized Educational Video(s) 12/12/2018 1125 by Dario Guardian, RN Outcome: Adequate for Discharge 12/09/2018 0724 by Dario Guardian, RN Outcome: Progressing   Problem: Activity: Goal: Ability to tolerate increased activity will improve 12/15/2018 1125 by Dario Guardian, RN Outcome: Adequate for Discharge 12/15/2018 0724 by Dario Guardian, RN Outcome: Progressing   Problem: Cardiac: Goal: Ability to achieve and maintain adequate cardiopulmonary perfusion will improve 12/18/2018 1125 by Dario Guardian, RN Outcome: Adequate for Discharge 12/07/2018 0724 by Dario Guardian, RN Outcome: Progressing Goal: Vascular access site(s) Level 0-1 will be maintained 11/28/2018 1125 by Dario Guardian, RN Outcome: Adequate for Discharge 11/27/2018 0724 by Dario Guardian, RN Outcome: Progressing   Problem: Health Behavior/Discharge Planning: Goal: Ability to safely manage health-related needs after discharge will improve 12/21/2018 1125 by Dario Guardian, RN Outcome: Adequate for Discharge 12/01/2018 0724 by Dario Guardian, RN Outcome: Progressing   Problem: Education: Goal: Knowledge of General Education information will improve Description: Including pain rating scale, medication(s)/side effects and non-pharmacologic comfort measures 11/27/2018 1125 by Dario Guardian, RN Outcome: Adequate for Discharge 12/21/2018 0724 by Dario Guardian, RN Outcome: Progressing   Problem: Health Behavior/Discharge Planning: Goal: Ability to manage health-related needs will improve 12/18/2018 1125 by Dario Guardian, RN Outcome:  Adequate for Discharge 11/30/2018 0724 by Dario Guardian, RN Outcome: Progressing   Problem: Clinical Measurements: Goal: Ability to maintain clinical measurements within normal limits will improve 12/05/2018 1125 by Dario Guardian, RN Outcome: Adequate for Discharge 12/16/2018 0724 by Dario Guardian, RN Outcome: Progressing Goal: Will remain free from infection 12/12/2018 1125 by Dario Guardian, RN Outcome: Adequate for Discharge 12/22/2018 0724 by Dario Guardian, RN Outcome: Progressing Goal: Diagnostic test results will improve 12/04/2018 1125 by Dario Guardian, RN Outcome: Adequate for Discharge 11/24/2018 0724 by Dario Guardian, RN Outcome: Progressing Goal: Respiratory complications will improve 11/29/2018 1125 by Dario Guardian, RN Outcome: Adequate for Discharge 12/09/2018 0724 by Dario Guardian, RN Outcome: Progressing Goal: Cardiovascular complication will be avoided 12/14/2018 1125 by Dario Guardian, RN Outcome: Adequate for Discharge 12/18/2018 0724 by Dario Guardian, RN Outcome: Progressing   Problem: Activity: Goal: Risk for activity intolerance will decrease 12/18/2018 1125 by Dario Guardian, RN Outcome: Adequate for Discharge 12/15/2018 0724 by Dario Guardian, RN Outcome: Progressing   Problem: Nutrition: Goal: Adequate nutrition will be maintained 12/11/2018 1125 by Dario Guardian, RN Outcome: Adequate for Discharge 12/12/2018 0724 by Dario Guardian, RN Outcome: Progressing   Problem: Coping: Goal: Level of anxiety will decrease 12/18/2018 1125 by Dario Guardian, RN Outcome: Adequate for Discharge 12/11/2018 0724 by Dario Guardian, RN Outcome: Progressing   Problem: Elimination: Goal: Will not experience complications related to bowel motility 12/03/2018 1125 by Dario Guardian, RN Outcome: Adequate for Discharge 12/02/2018 0724 by Dario Guardian, RN Outcome: Progressing Goal: Will not experience complications related to urinary retention 12/18/2018 1125 by Dario Guardian, RN Outcome: Adequate for Discharge 12/23/2018  0724 by Dario Guardian, RN Outcome: Progressing   Problem: Pain Managment: Goal: General experience of comfort will improve 12/05/2018 1125 by Dario Guardian, RN Outcome: Adequate for Discharge 12/05/2018 0724 by Dario Guardian, RN Outcome: Progressing   Problem: Safety: Goal: Ability to remain free from injury will improve 12/12/2018 1125 by Dario Guardian,  RN Outcome: Adequate for Discharge 11/24/2018 0724 by Dario Guardian, RN Outcome: Progressing   Problem: Skin Integrity: Goal: Risk for impaired skin integrity will decrease 11/30/2018 1125 by Dario Guardian, RN Outcome: Adequate for Discharge 12/22/2018 0724 by Dario Guardian, RN Outcome: Progressing

## 2018-12-19 NOTE — Discharge Instructions (Signed)
Coronary Angiogram With Stent, Care After °This sheet gives you information about how to care for yourself after your procedure. Your health care provider may also give you more specific instructions. If you have problems or questions, contact your health care provider. °What can I expect after the procedure? °After your procedure, it is common to have: °· Bruising in the area where a small, thin tube (catheter) was inserted. This usually fades within 1-2 weeks. °· Blood collecting in the tissue (hematoma) that may be painful to the touch. It should usually decrease in size and tenderness within 1-2 weeks. °Follow these instructions at home: °Insertion area care °· Do not take baths, swim, or use a hot tub until your health care provider approves. °· You may shower 24-48 hours after the procedure or as directed by your health care provider. °· Follow instructions from your health care provider about how to take care of your incision. Make sure you: °? Wash your hands with soap and water before you change your bandage (dressing). If soap and water are not available, use hand sanitizer. °? Change your dressing as told by your health care provider. °? Leave stitches (sutures), skin glue, or adhesive strips in place. These skin closures may need to stay in place for 2 weeks or longer. If adhesive strip edges start to loosen and curl up, you may trim the loose edges. Do not remove adhesive strips completely unless your health care provider tells you to do that. °· Remove the bandage (dressing) and gently wash the catheter insertion site with plain soap and water. °· Pat the area dry with a clean towel. Do not rub the area, because that may cause bleeding. °· Do not apply powder or lotion to the incision area. °· Check your incision area every day for signs of infection. Check for: °? More redness, swelling, or pain. °? More fluid or blood. °? Warmth. °? Pus or a bad smell. °Activity °· Do not drive for 24 hours if you  were given a medicine to help you relax (sedative). °· Do not lift anything that is heavier than 10 lb (4.5 kg) for 5 days after your procedure or as directed by your health care provider. °· Ask your health care provider when it is okay for you: °? To return to work or school. °? To resume usual physical activities or sports. °? To resume sexual activity. °Eating and drinking ° °· Eat a heart-healthy diet. This should include plenty of fresh fruits and vegetables. °· Avoid the following types of food: °? Food that is high in salt. °? Canned or highly processed food. °? Food that is high in saturated fat or sugar. °? Fried food. °· Limit alcohol intake to no more than 1 drink a day for non-pregnant women and 2 drinks a day for men. One drink equals 12 oz of beer, 5 oz of wine, or 1½ oz of hard liquor. °Lifestyle ° °· Do not use any products that contain nicotine or tobacco, such as cigarettes and e-cigarettes. If you need help quitting, ask your health care provider. °· Take steps to manage and control your weight. °· Get regular exercise. °· Manage your blood pressure. °· Manage other health problems, such as diabetes. °General instructions °· Take over-the-counter and prescription medicines only as told by your health care provider. Blood thinners may be prescribed after your procedure to improve blood flow through the stent. °· If you need an MRI after your heart stent has been placed,   be sure to tell the health care provider who orders the MRI that you have a heart stent.  Keep all follow-up visits as directed by your health care provider. This is important. Contact a health care provider if:  You have a fever.  You have chills.  You have increased bleeding from the catheter insertion area. Hold pressure on the area. Get help right away if:  You develop chest pain or shortness of breath.  You feel faint or you pass out.  You have unusual pain at the catheter insertion area.  You have redness,  warmth, or swelling at the catheter insertion area.  You have drainage (other than a small amount of blood on the dressing) from the catheter insertion area.  The catheter insertion area is bleeding, and the bleeding does not stop after 30 minutes of holding steady pressure on the area.  You develop bleeding from any other place, such as from your rectum. There may be bright red blood in your urine or stool, or it may appear as black, tarry stool. This information is not intended to replace advice given to you by your health care provider. Make sure you discuss any questions you have with your health care provider. Document Released: 09/28/2004 Document Revised: 02/21/2017 Document Reviewed: 12/07/2015 Elsevier Patient Education  2020 [place]. Heart Attack A heart attack occurs when blood and oxygen supply to the heart is cut off. A heart attack causes damage to the heart that cannot be fixed. A heart attack is also called a myocardial infarction, or MI. If you think you are having a heart attack, do not wait to see if the symptoms will go away. Get medical help right away. What are the causes? This condition may be caused by:  A fatty substance (plaque) in the blood vessels (arteries). This can block the flow of blood to the heart.  A blood clot in the blood vessels that go to the heart. The blood clot blocks blood flow.  Low blood pressure.  An abnormal heartbeat.  Some diseases, such as problems in red blood cells (anemia)orproblems in breathing (respiratory failure).  Tightening (spasm) of a blood vessel that cuts off blood to the heart.  A tear in a blood vessel of the heart.  High blood pressure. What increases the risk? The following factors may make you more likely to develop this condition:  Aging. The older you are, the higher your risk.  Having a personal or family history of chest pain, heart attack, stroke, or narrowing of the arteries in the legs, arms, head, or  stomach (peripheral artery disease).  Being female.  Smoking.  Not getting regular exercise.  Being overweight or obese.  Having high blood pressure.  Having high cholesterol.  Having diabetes.  Drinking too much alcohol.  Using illegal drugs, such as cocaine or methamphetamine. What are the signs or symptoms? Symptoms of this condition include:  Chest pain. It may feel like: ? Crushing or squeezing. ? Tightness, pressure, fullness, or heaviness.  Pain in the arm, neck, jaw, back, or upper body.  Shortness of breath.  Heartburn.  Upset stomach (indigestion).  Feeling like you may vomit (nauseous).  Cold sweats.  Feeling tired.  Sudden light-headedness. How is this treated? A heart attack must be treated as soon as possible. Treatment may include:  Medicines to: ? Break up or dissolve blood clots. ? Thin blood and help prevent blood clots. ? Treat blood pressure. ? Improve blood flow to the heart. ?  Reduce pain. ? Reduce cholesterol.  Procedures to widen a blocked artery and keep it open.  Open heart surgery.  Receiving oxygen.  Making your heart strong again (cardiac rehabilitation) through exercise, education, and counseling. Follow these instructions at home: Medicines  Take over-the-counter and prescription medicines only as told by your doctor. You may need to take medicine: ? To keep your blood from clotting too easily. ? To control blood pressure. ? To lower cholesterol. ? To control heart rhythms.  Do not take these medicines unless your doctor says it is okay: ? NSAIDs, such as ibuprofen. ? Supplements that have vitamin A, vitamin E, or both. ? Hormone replacement therapy that has estrogen with or without progestin. Lifestyle      Do not use any products that have nicotine or tobacco, such as cigarettes, e-cigarettes, and chewing tobacco. If you need help quitting, ask your doctor.  Avoid secondhand smoke.  Exercise regularly. Ask  your doctor about a cardiac rehab program.  Eat heart-healthy foods. Your doctor will tell you what foods to eat.  Stay at a healthy weight.  Lower your stress level.  Do not use illegal drugs. Alcohol use  Do not drink alcohol if: ? Your doctor tells you not to drink. ? You are pregnant, may be pregnant, or are planning to become pregnant.  If you drink alcohol: ? Limit how much you use to:  0-1 drink a day for women.  0-2 drinks a day for men. ? Know how much alcohol is in your drink. In the [place] one drink equals one 12 oz bottle of beer (355 mL), one 5 oz glass of wine (148 mL), or one 1 oz glass of hard liquor (44 mL). General instructions  Work with your doctor to treat other problems you may have, such as diabetes or high blood pressure.  Get screened for depression. Get treatment if needed.  Keep your vaccines up to date. Get the flu shot (influenza vaccine) every year.  Keep all follow-up visits as told by your doctor. This is important. Contact a doctor if:  You feel very sad.  You have trouble doing your daily activities. Get help right away if:  You have sudden, unexplained discomfort in your chest, arms, back, neck, jaw, or upper body.  You have shortness of breath.  You have sudden sweating or clammy skin.  You feel like you may vomit.  You vomit.  You feel tired or weak.  You get light-headed or dizzy.  You feel your heart beating fast.  You feel your heart skipping beats.  You have blood pressure that is higher than 180/120. These symptoms may be an emergency. Do not wait to see if the symptoms will go away. Get medical help right away. Call your local emergency services (911 in the [place] Do not drive yourself to the hospital. Summary  A heart attack occurs when blood and oxygen supply to the heart is cut off.  Do not take NSAIDs unless your doctor says it is okay.  Do not smoke. Avoid secondhand smoke.  Exercise regularly. Ask your  doctor about a cardiac rehab program. This information is not intended to replace advice given to you by your health care provider. Make sure you discuss any questions you have with your health care provider. Document Released: 09/10/2011 Document Revised: 06/22/2018 Document Reviewed: 06/22/2018 Elsevier Patient Education  [place].

## 2018-12-19 NOTE — Progress Notes (Signed)
Progress Note  Patient Name: Jasmine Stephens Date of Encounter: 12/22/2018  Primary Cardiologist:   No primary care provider on file.   Subjective   No chest pain.  No SOB.   Inpatient Medications    Scheduled Meds: . aspirin  81 mg Oral Daily  . atorvastatin  80 mg Oral q1800  . Chlorhexidine Gluconate Cloth  6 each Topical Daily  . enoxaparin (LOVENOX) injection  40 mg Subcutaneous Q24H  . losartan  25 mg Oral Daily  . metoprolol tartrate  12.5 mg Oral BID  . oxymetazoline  1 spray Each Nare BID  . pantoprazole  40 mg Oral Daily  . sodium chloride flush  3 mL Intravenous Q12H  . ticagrelor  90 mg Oral BID   Continuous Infusions: . sodium chloride     PRN Meds: sodium chloride, acetaminophen, ALPRAZolam, nitroGLYCERIN, ondansetron (ZOFRAN) IV, sodium chloride flush, zolpidem   Vital Signs    Vitals:   12/18/18 2331 11/24/2018 0000 12/09/2018 0341 12/23/2018 0636  BP:   109/60   Pulse:   63   Resp:  16    Temp: 97.8 F (36.6 C)  97.8 F (36.6 C)   TempSrc: Oral  Oral   SpO2:   96%   Weight:    96.8 kg  Height:        Intake/Output Summary (Last 24 hours) at 12/10/2018 0801 Last data filed at 12/18/2018 2240 Gross per 24 hour  Intake 1323 ml  Output -  Net 1323 ml   Filed Weights   12/17/18 1900 12/10/2018 0636  Weight: 92.3 kg 96.8 kg    Telemetry    NSR - Personally Reviewed  ECG    NA - Personally Reviewed  Physical Exam   GEN: No acute distress.   Neck: No  JVD Cardiac: RRR, no murmurs, rubs, or gallops.  Respiratory: Clear  to auscultation bilaterally. GI: Soft, nontender, non-distended  MS: No  edema; No deformity.  Right radial access site without bruising or bleeding Neuro:  Nonfocal  Psych: Normal affect   Labs    Chemistry Recent Labs  Lab 12/17/18 1621 12/17/18 1622 12/18/18 0448  NA 137 141 138  K 4.1 4.2 3.9  CL 109 107 108  CO2 20*  --  23  GLUCOSE 177* 174* 110*  BUN 19 20 15   CREATININE 0.94 0.80 0.85  CALCIUM 8.5*  --   8.9  PROT 6.2*  --  5.9*  ALBUMIN 3.3*  --  3.1*  AST 21  --  66*  ALT 21  --  28  ALKPHOS 72  --  66  BILITOT <0.1*  --  0.4  GFRNONAA >60  --  >60  GFRAA >60  --  >60  ANIONGAP 8  --  7     Hematology Recent Labs  Lab 12/17/18 1621 12/17/18 1622 12/18/18 0448  WBC 8.8  --  8.8  RBC 4.24  --  4.11  HGB 12.5 12.6 12.3  HCT 37.7 37.0 36.1  MCV 88.9  --  87.8  MCH 29.5  --  29.9  MCHC 33.2  --  34.1  RDW 12.7  --  12.9  PLT 292  --  276    Cardiac EnzymesNo results for input(s): TROPONINI in the last 168 hours. No results for input(s): TROPIPOC in the last 168 hours.   BNPNo results for input(s): BNP, PROBNP in the last 168 hours.   DDimer No results for input(s): DDIMER in the last 168  hours.   Radiology    No results found.  Cardiac Studies   CATH    Coronary/Graft Acute MI Revascularization  LEFT HEART CATH AND CORONARY ANGIOGRAPHY  Conclusion  Conclusions: 1. Severe single-vessel coronary artery disease with 100% thrombotic occlusion of the mid RCA. Mild proximal and mid LAD disease is also noted. Transient vasospasm occurred in the RCA as well, which resolved with intracoronary nitroglycerin. 2. Low normal left ventricular systolic function with basal and mid inferior hypokinesis. LVEF 50-55%. 3. Normal left ventricular filling pressure. 4. Successful PCI to proximal and mid RCA with overlapping Synergy 2.75 x 20 mm (proximal) and Synergy 2.75 x 28 mm (distal) drug-eluting stents with 0% residual stenosis and TIMI-3 flow. A second stent was needed due to plaque shift versus focal dissection at the proximal edge of the more distal stent.   Echo   1. Left ventricular ejection fraction, by visual estimation, is 60 to 65%. The left ventricle has normal function. Normal left ventricular size. There is no left ventricular hypertrophy.  2. Global right ventricle has normal systolic function.The right ventricular size is normal. No increase in right  ventricular wall thickness.  3. The mitral valve is normal in structure. Mild mitral valve regurgitation.  Patient Profile     68 y.o. female with a history of GERD, IBS, colon polyps, OA, anxiety/depression. EMS was called for chest pain, ECG c/w inferior STEMI.  Assessment & Plan    CAD:   OK to go home.  Wants to follow with Dr. Gwenlyn Found.   Meds as on MAR  DYSLIPIDEMIA:    Home on high dose statin.     For questions or updates, please contact [place] Please consult [email or web address] for contact info under Cardiology/STEMI.   Signed, Minus Breeding, MD  11/24/2018, 8:01 AM

## 2018-12-19 NOTE — Discharge Summary (Deleted)
Erlene Quan, PA-C  Physician Assistant  Cardiology  Death Summary Note  Cosign Needed  Date of Service:  12/20/2018 10:15 AM          Cosign Needed      Expand All Collapse All    Show:Clear all [] Manual[] Template[] Copied  Added by: [x] Erlene Quan, PA-C  [] Hover for details   Discharge Summary    Patient ID: Jasmine Stephens MRN: [id number]; DOB: 01-13-1951  Admit date: 12/17/2018 Discharge date: 12/20/2018  Primary Care Provider: Chevis Pretty, FNP  Primary Cardiologist: Pt requests Dr Gwenlyn Found Primary Electrophysiologist:  None   Discharge Diagnoses    Principal Problem:   STEMI involving right coronary artery [facility]) Active Problems:   GERD   Dyslipidemia, goal LDL below 70   Moderate obesity   Prediabetes   CAD S/P percutaneous coronary angioplasty   Allergies      Allergies  Allergen Reactions  . Compazine [Prochlorperazine] Anaphylaxis  . Phenothiazines Anaphylaxis  . Chlorpromazine Hcl Other (See Comments)    Reaction unknown  . Penicillins Other (See Comments)    Reaction unkwown Did it involve swelling of the face/tongue/throat, SOB, or low BP? Unknown Did it involve sudden or severe rash/hives, skin peeling, or any reaction on the inside of your mouth or nose? Unknown Did you need to seek medical attention at a hospital or doctor's office? Unknown When did it last happen? unk If all above answers are "NO", may proceed with cephalosporin use.   . Latex Rash    With continued exposure to latex  . Tape Rash  . Tapentadol Rash    Diagnostic Studies/Procedures    Urgent cath/PCI 12/17/2018 Echo 12/18/2018 _____________   History of Present Illness     [name] with no prior cardiac history presented 12/17/2018 with inferior STEMI.  Hospital Course      Patient is a pleasant 68 year old female followed at [facility].  She just retired from [employer].  She has had no prior  history of coronary disease or cardiac work-up.  Her only medication prior to admission was Prilosec and meloxicam.  On 12/17/2018 she developed sudden chest pain described as 9 out of 10.  EMS was called and EKG reportedly showed an inferior ST EMI.  She was transferred urgently to [facility] and taken to the Cath Lab where catheterization revealed an occluded mid RCA lesion.  This was dilated and treated with a DES with good result.  She had 30% residual narrowing in the LAD but no other significant coronary disease.  Echocardiogram showed normal LV function.  Her medications were adjusted and she was seen on rounds 12/17/2018 and felt to be stable for discharge.  She was placed on high-dose statin therapy, beta-blocker, aspirin, and Brilinta.  Her omeprazole was changed to Protonix.  At discharge she complained of some trouble sleeping and asked for sleep medication, we deferred this to her PCP and suggested Benadryl at bedtime as needed in the interim.  The patient also requested follow-up with Dr. Gwenlyn Found who has taken care of other family members.  We will arrange early office follow-up in 7 to 14 days at [facility] office with an APP and Dr Gwenlyn Found can follow long term.  _____________  Discharge Vitals Blood pressure (!) 116/54, pulse 63, temperature 98.3 F (36.8 C), temperature source Oral, resp. rate 17, height 5\' 6"  (1.676 m), weight 96.8 kg, SpO2 100 %.      Filed Weights   12/17/18 1900 12/14/2018  0636  Weight: 92.3 kg 96.8 kg    Labs & Radiologic Studies    CBC Recent Labs (last 2 labs)        Recent Labs    12/17/18 1621 12/17/18 1622 12/18/18 0448  WBC 8.8  --  8.8  HGB 12.5 12.6 12.3  HCT 37.7 37.0 36.1  MCV 88.9  --  87.8  PLT 292  --  276     Basic Metabolic Panel Recent Labs (last 2 labs)        Recent Labs    12/17/18 1621 12/17/18 1622 12/18/18 0448  NA 137 141 138  K 4.1 4.2 3.9  CL 109 107 108  CO2 20*  --  23  GLUCOSE 177* 174* 110*  BUN 19 20 15    CREATININE 0.94 0.80 0.85  CALCIUM 8.5*  --  8.9     Liver Function Tests Recent Labs (last 2 labs)       Recent Labs    12/17/18 1621 12/18/18 0448  AST 21 66*  ALT 21 28  ALKPHOS 72 66  BILITOT <0.1* 0.4  PROT 6.2* 5.9*  ALBUMIN 3.3* 3.1*     Recent Labs (last 2 labs)   No results for input(s): LIPASE, AMYLASE in the last 72 hours.   High Sensitivity Troponin:   Last Labs          Recent Labs  Lab 12/17/18 1934 12/17/18 2216 12/18/18 0448 12/18/18 1045 12/18/18 1903  TROPONINIHS 9,227* 10,506* 15,188* 8,680* 5,149*      BNP Recent Labs (last 2 labs)   Invalid input(s): POCBNP   D-Dimer Recent Labs (last 2 labs)   No results for input(s): DDIMER in the last 72 hours.   Hemoglobin A1C Recent Labs (last 2 labs)      Recent Labs    12/17/18 1934  HGBA1C 6.0*     Fasting Lipid Panel Recent Labs (last 2 labs)      Recent Labs    12/18/18 0448  CHOL 211*  HDL 42  LDLCALC 147*  TRIG 109  CHOLHDL 5.0     Thyroid Function Tests Recent Labs (last 2 labs)      Recent Labs    12/17/18 1934  TSH 1.614     _____________  Imaging Results  No results found.   Disposition   Pt is being discharged home today in good condition.  Follow-up Plans & Appointments       Follow-up Information    Lorretta Harp, MD Follow up.   Specialties: Cardiology, Radiology Why: office will contact you Contact information: [address] [place] [address] [phone]            Kerin Ransom PA-C 11/24/2018 10:53 AM

## 2018-12-19 NOTE — Plan of Care (Signed)

## 2018-12-19 NOTE — Progress Notes (Signed)
Reviewed Brilinta with pt and answered questions. She has been up in the room but feels nauseated right now so deferred walking in hall. Ready for d/c. [id number] Yves Dill CES, ACSM 10:02 AM 11/29/2018

## 2018-12-19 NOTE — Discharge Summary (Signed)
Discharge Summary    Patient ID: Jasmine Stephens MRN: [id number]; DOB: April 09, 1950  Admit date: 12/17/2018 Discharge date: 12/12/2018  Primary Care Provider: Chevis Pretty, [place]  Primary Cardiologist: Dr Gwenlyn Found (pt request) Primary Electrophysiologist:  None   Discharge Diagnoses    Principal Problem:   STEMI involving right coronary artery [facility]) Active Problems:   GERD   Dyslipidemia, goal LDL below 70   Moderate obesity   Prediabetes   CAD S/P percutaneous coronary angioplasty   Allergies Allergies  Allergen Reactions  . Compazine [Prochlorperazine] Anaphylaxis  . Phenothiazines Anaphylaxis  . Chlorpromazine Hcl Other (See Comments)    Reaction unknown  . Penicillins Other (See Comments)    Reaction unkwown Did it involve swelling of the face/tongue/throat, SOB, or low BP? Unknown Did it involve sudden or severe rash/hives, skin peeling, or any reaction on the inside of your mouth or nose? Unknown Did you need to seek medical attention at a hospital or doctor's office? Unknown When did it last happen? unk If all above answers are "NO", may proceed with cephalosporin use.   . Latex Rash    With continued exposure to latex  . Tape Rash  . 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 presented 12/17/2018 with an inferior STEMI  Hospital Course      Patient is a pleasant 68 year old female followed at [facility].  She just retired from [employer].  She has had no prior history of coronary disease or cardiac work-up.  Her only medication prior to admission was Prilosec and meloxicam.  On 12/17/2018 she developed sudden chest pain described as 9 out of 10.  EMS was called and EKG reportedly showed an inferior ST EMI.  She was transferred urgently to [facility] and taken to the Cath Lab where catheterization  revealed an occluded mid RCA lesion.  This was dilated and treated with a DES with good result.  She had 30% residual narrowing in the LAD but no other significant coronary disease.  Echocardiogram showed normal LV function.  Her medications were adjusted and she was seen on rounds 12/17/2018 and felt to be stable for discharge.  She was placed on high-dose statin therapy, beta-blocker, aspirin, and Brilinta.  Her omeprazole was changed to Protonix.  At discharge she complained of some trouble sleeping and asked for sleep medication, we deferred this to her PCP and suggested Benadryl at bedtime as needed in the interim.  The patient also requested follow-up with Dr. Gwenlyn Found who has taken care of other family members.  We will arrange early office follow-up in 7 to 14 days at [facility] office with an APP and Dr Gwenlyn Found can follow long term.  _____________  Discharge Vitals Blood pressure (!) 116/54, pulse 63, temperature 98.3 F (36.8 C), temperature source Oral, resp. rate 17, height 5\' 6"  (1.676 m), weight 96.8 kg, SpO2 100 %.  Filed Weights   12/17/18 1900 12/15/2018 0636  Weight: 92.3 kg 96.8 kg    Labs & Radiologic Studies    CBC Recent Labs    12/17/18 1621 12/17/18 1622 12/18/18 0448  WBC 8.8  --  8.8  HGB 12.5 12.6 12.3  HCT 37.7 37.0 36.1  MCV 88.9  --  87.8  PLT 292  --  [id number]   Basic Metabolic Panel Recent Labs    12/17/18 1621 12/17/18 1622  12/18/18 0448  NA 137 141 138  K 4.1 4.2 3.9  CL 109 107 108  CO2 20*  --  23  GLUCOSE 177* 174* 110*  BUN 19 20 15   CREATININE 0.94 0.80 0.85  CALCIUM 8.5*  --  8.9   Liver Function Tests Recent Labs    12/17/18 1621 12/18/18 0448  AST 21 66*  ALT 21 28  ALKPHOS 72 66  BILITOT <0.1* 0.4  PROT 6.2* 5.9*  ALBUMIN 3.3* 3.1*   No results for input(s): LIPASE, AMYLASE in the last 72 hours. High Sensitivity Troponin:   Recent Labs  Lab 12/17/18 1934 12/17/18 2216 12/18/18 0448 12/18/18 1045 12/18/18 1903  TROPONINIHS  9,227* 10,506* 15,188* 8,680* 5,149*    BNP Invalid input(s): POCBNP D-Dimer No results for input(s): DDIMER in the last 72 hours. Hemoglobin A1C Recent Labs    12/17/18 1934  HGBA1C 6.0*   Fasting Lipid Panel Recent Labs    12/18/18 0448  CHOL 211*  HDL 42  LDLCALC 147*  TRIG 109  CHOLHDL 5.0   Thyroid Function Tests Recent Labs    12/17/18 1934  TSH 1.614   _____________  No results found. Disposition   Pt is being discharged home today in good condition.  Follow-up Plans & Appointments    Follow-up Information    Lorretta Harp, MD Follow up.   Specialties: Cardiology, Radiology Why: office will contact you Contact information: [address] [place] [address] [phone]          Discharge Instructions    Amb Referral to Cardiac Rehabilitation   Complete by: As directed    Diagnosis:  Coronary Stents STEMI     After initial evaluation and assessments completed: Virtual Based Care may be provided alone or in conjunction with Phase 2 Cardiac Rehab based on patient barriers.: Yes      Discharge Medications   Allergies as of 12/15/2018      Reactions   Compazine [prochlorperazine] Anaphylaxis   Phenothiazines Anaphylaxis   Chlorpromazine Hcl Other (See Comments)   Reaction unknown   Penicillins Other (See Comments)   Reaction unkwown Did it involve swelling of the face/tongue/throat, SOB, or low BP? Unknown Did it involve sudden or severe rash/hives, skin peeling, or any reaction on the inside of your mouth or nose? Unknown Did you need to seek medical attention at a hospital or doctor's office? Unknown When did it last happen? unk If all above answers are "NO", may proceed with cephalosporin use.   Latex Rash   With continued exposure to latex   Tape Rash   Tapentadol Rash      Medication List    STOP taking these medications   omeprazole 20 MG capsule Commonly known as: PRILOSEC     TAKE these  medications   acetaminophen 500 MG tablet Commonly known as: TYLENOL Take 500 mg by mouth every 6 (six) hours as needed for mild pain or headache.   aspirin 81 MG chewable tablet Chew 1 tablet (81 mg total) by mouth daily. Start taking on: December 20, 2018   atorvastatin 80 MG tablet Commonly known as: LIPITOR Take 1 tablet (80 mg total) by mouth daily at 6 PM.   FISH OIL PO Take 2 capsules by mouth daily.   losartan 25 MG tablet Commonly known as: COZAAR Take 1 tablet (25 mg total) by mouth daily. Start taking on: December 20, 2018   meloxicam 15 MG tablet Commonly known as: MOBIC Take 15  mg by mouth daily as needed (knee pain).   metoprolol tartrate 25 MG tablet Commonly known as: LOPRESSOR Take 0.5 tablets (12.5 mg total) by mouth 2 (two) times daily.   multivitamin with minerals Tabs tablet Take 1 tablet by mouth daily.   nitroGLYCERIN 0.4 MG SL tablet Commonly known as: NITROSTAT Place 1 tablet (0.4 mg total) under the tongue every 5 (five) minutes x 3 doses as needed for chest pain.   pantoprazole 40 MG tablet Commonly known as: PROTONIX Take 1 tablet (40 mg total) by mouth daily. Start taking on: December 20, 2018   ticagrelor 90 MG Tabs tablet Commonly known as: BRILINTA Take 1 tablet (90 mg total) by mouth 2 (two) times daily.        Acute coronary syndrome (MI, NSTEMI, STEMI, etc) this admission?: Yes.     AHA/ACC Clinical Performance & Quality Measures: 1. Aspirin prescribed? - Yes 2. ADP Receptor Inhibitor (Plavix/Clopidogrel, Brilinta/Ticagrelor or Effient/Prasugrel) prescribed (includes medically managed patients)? - Yes 3. Beta Blocker prescribed? - Yes 4. High Intensity Statin (Lipitor 40-80mg  or Crestor 20-40mg ) prescribed? - Yes 5. EF assessed during THIS hospitalization? - Yes 6. For EF <40%, was ACEI/ARB prescribed? - Not Applicable (EF >/= [id number]) 7. For EF <40%, Aldosterone Antagonist (Spironolactone or Eplerenone) prescribed? - Not  Applicable (EF >/= [id number]) 8. Cardiac Rehab Phase II ordered (Included Medically managed Patients)? - Yes     Outstanding Labs/Studies     Duration of Discharge Encounter   Greater than 30 minutes including physician time.  Angelena Form, PA-C 11/28/2018, 12:05 PM

## 2018-12-24 NOTE — Death Summary Note (Deleted)
Discharge Summary    Patient ID: Jasmine Stephens MRN: [id number]; DOB: 1950/06/16  Admit date: 12/17/2018 Discharge date: 12/18/2018  Primary Care Provider: Chevis Pretty, FNP  Primary Cardiologist: Pt requests Dr Gwenlyn Found Primary Electrophysiologist:  None   Discharge Diagnoses    Principal Problem:   STEMI involving right coronary artery [facility]) Active Problems:   GERD   Dyslipidemia, goal LDL below 70   Moderate obesity   Prediabetes   CAD S/P percutaneous coronary angioplasty   Allergies Allergies  Allergen Reactions  . Compazine [Prochlorperazine] Anaphylaxis  . Phenothiazines Anaphylaxis  . Chlorpromazine Hcl Other (See Comments)    Reaction unknown  . Penicillins Other (See Comments)    Reaction unkwown Did it involve swelling of the face/tongue/throat, SOB, or low BP? Unknown Did it involve sudden or severe rash/hives, skin peeling, or any reaction on the inside of your mouth or nose? Unknown Did you need to seek medical attention at a hospital or doctor's office? Unknown When did it last happen? unk If all above answers are "NO", may proceed with cephalosporin use.   . Latex Rash    With continued exposure to latex  . Tape Rash  . Tapentadol Rash    Diagnostic Studies/Procedures    Urgent cath/PCI 12/17/2018 Echo 12/18/2018 _____________   History of Present Illness     [name] with no prior cardiac history presented 12/17/2018 with inferior STEMI.  Hospital Course      Patient is a pleasant 68 year old female followed at [facility].  She just retired from [employer].  She has had no prior history of coronary disease or cardiac work-up.  Her only medication prior to admission was Prilosec and meloxicam.  On 12/17/2018 she developed sudden chest pain described as 9 out of 10.  EMS was called and EKG reportedly showed an inferior ST EMI.  She was transferred urgently to [facility] and taken to the Cath Lab where  catheterization revealed an occluded mid RCA lesion.  This was dilated and treated with a DES with good result.  She had 30% residual narrowing in the LAD but no other significant coronary disease.  Echocardiogram showed normal LV function.  Her medications were adjusted and she was seen on rounds 11/28/2018 and felt to be stable for discharge.  She was placed on high-dose statin therapy, beta-blocker, aspirin, and Brilinta.  Her omeprazole was changed to Protonix.  At discharge she complained of some trouble sleeping and asked for sleep medication, we deferred this to her PCP and suggested Benadryl at bedtime as needed in the interim.  The patient also requested follow-up with Dr. Gwenlyn Found who has taken care of other family members.  We will arrange early office follow-up in 7 to 14 days at [facility] office with an APP and Dr Gwenlyn Found can follow long term.  _____________  Discharge Vitals Blood pressure (!) 116/54, pulse 63, temperature 98.3 F (36.8 C), temperature source Oral, resp. rate 17, height 5\' 6"  (1.676 m), weight 96.8 kg, SpO2 100 %.  Filed Weights   12/17/18 1900 12/14/2018 0636  Weight: 92.3 kg 96.8 kg    Labs & Radiologic Studies    CBC Recent Labs    12/17/18 1621 12/17/18 1622 12/18/18 0448  WBC 8.8  --  8.8  HGB 12.5 12.6 12.3  HCT 37.7 37.0 36.1  MCV 88.9  --  87.8  PLT 292  --  [id number]   Basic Metabolic Panel Recent Labs    12/17/18 1621  12/17/18 1622 12/18/18 0448  NA 137 141 138  K 4.1 4.2 3.9  CL 109 107 108  CO2 20*  --  23  GLUCOSE 177* 174* 110*  BUN 19 20 15   CREATININE 0.94 0.80 0.85  CALCIUM 8.5*  --  8.9   Liver Function Tests Recent Labs    12/17/18 1621 12/18/18 0448  AST 21 66*  ALT 21 28  ALKPHOS 72 66  BILITOT <0.1* 0.4  PROT 6.2* 5.9*  ALBUMIN 3.3* 3.1*   No results for input(s): LIPASE, AMYLASE in the last 72 hours. High Sensitivity Troponin:   Recent Labs  Lab 12/17/18 1934 12/17/18 2216 12/18/18 0448 12/18/18 1045 12/18/18 1903   TROPONINIHS 9,227* 10,506* 15,188* 8,680* 5,149*    BNP Invalid input(s): POCBNP D-Dimer No results for input(s): DDIMER in the last 72 hours. Hemoglobin A1C Recent Labs    12/17/18 1934  HGBA1C 6.0*   Fasting Lipid Panel Recent Labs    12/18/18 0448  CHOL 211*  HDL 42  LDLCALC 147*  TRIG 109  CHOLHDL 5.0   Thyroid Function Tests Recent Labs    12/17/18 1934  TSH 1.614   _____________  No results found. Disposition   Pt is being discharged home today in good condition.  Follow-up Plans & Appointments    Follow-up Information    Lorretta Harp, MD Follow up.   Specialties: Cardiology, Radiology Why: office will contact you Contact information: [address] [place] [address] [phone]          Discharge Instructions    Amb Referral to Cardiac Rehabilitation   Complete by: As directed    Diagnosis:  Coronary Stents STEMI     After initial evaluation and assessments completed: Virtual Based Care may be provided alone or in conjunction with Phase 2 Cardiac Rehab based on patient barriers.: Yes      Discharge Medications   Allergies as of 12/18/2018      Reactions   Compazine [prochlorperazine] Anaphylaxis   Phenothiazines Anaphylaxis   Chlorpromazine Hcl Other (See Comments)   Reaction unknown   Penicillins Other (See Comments)   Reaction unkwown Did it involve swelling of the face/tongue/throat, SOB, or low BP? Unknown Did it involve sudden or severe rash/hives, skin peeling, or any reaction on the inside of your mouth or nose? Unknown Did you need to seek medical attention at a hospital or doctor's office? Unknown When did it last happen? unk If all above answers are "NO", may proceed with cephalosporin use.   Latex Rash   With continued exposure to latex   Tape Rash   Tapentadol Rash      Medication List    STOP taking these medications   omeprazole 20 MG capsule Commonly known as: PRILOSEC     TAKE  these medications   acetaminophen 500 MG tablet Commonly known as: TYLENOL Take 500 mg by mouth every 6 (six) hours as needed for mild pain or headache.   aspirin 81 MG chewable tablet Chew 1 tablet (81 mg total) by mouth daily. Start taking on: December 20, 2018   atorvastatin 80 MG tablet Commonly known as: LIPITOR Take 1 tablet (80 mg total) by mouth daily at 6 PM.   FISH OIL PO Take 2 capsules by mouth daily.   losartan 25 MG tablet Commonly known as: COZAAR Take 1 tablet (25 mg total) by mouth daily. Start taking on: December 20, 2018   meloxicam 15 MG tablet Commonly known as: MOBIC  Take 15 mg by mouth daily as needed (knee pain).   metoprolol tartrate 25 MG tablet Commonly known as: LOPRESSOR Take 0.5 tablets (12.5 mg total) by mouth 2 (two) times daily.   multivitamin with minerals Tabs tablet Take 1 tablet by mouth daily.   nitroGLYCERIN 0.4 MG SL tablet Commonly known as: NITROSTAT Place 1 tablet (0.4 mg total) under the tongue every 5 (five) minutes x 3 doses as needed for chest pain.   pantoprazole 40 MG tablet Commonly known as: PROTONIX Take 1 tablet (40 mg total) by mouth daily. Start taking on: December 20, 2018   ticagrelor 90 MG Tabs tablet Commonly known as: BRILINTA Take 1 tablet (90 mg total) by mouth 2 (two) times daily.        Acute coronary syndrome (MI, NSTEMI, STEMI, etc) this admission?: Yes.     AHA/ACC Clinical Performance & Quality Measures: 1. Aspirin prescribed? - Yes 2. ADP Receptor Inhibitor (Plavix/Clopidogrel, Brilinta/Ticagrelor or Effient/Prasugrel) prescribed (includes medically managed patients)? - Yes 3. Beta Blocker prescribed? - Yes 4. High Intensity Statin (Lipitor 40-80mg  or Crestor 20-40mg ) prescribed? - Yes 5. EF assessed during THIS hospitalization? - Yes 6. For EF <40%, was ACEI/ARB prescribed? - Not Applicable (EF >/= AB-123456789) 7. For EF <40%, Aldosterone Antagonist (Spironolactone or Eplerenone) prescribed? -  Not Applicable (EF >/= AB-123456789) 8. Cardiac Rehab Phase II ordered (Included Medically managed Patients)? - Yes     Outstanding Labs/Studies     Duration of Discharge Encounter   Greater than 30 minutes including physician time.  Signed, Kerin Ransom, PA-C 12/14/2018, 10:16 AM

## 2018-12-24 DEATH — deceased

## 2018-12-31 ENCOUNTER — Ambulatory Visit (INDEPENDENT_AMBULATORY_CARE_PROVIDER_SITE_OTHER): Payer: Medicare Other | Admitting: General Practice

## 2018-12-31 ENCOUNTER — Encounter: Payer: Self-pay | Admitting: Cardiology

## 2018-12-31 ENCOUNTER — Other Ambulatory Visit: Payer: Self-pay

## 2018-12-31 VITALS — BP 140/66 | HR 52 | Temp 97.2°F | Ht 66.5 in | Wt 215.0 lb

## 2018-12-31 DIAGNOSIS — E668 Other obesity: Secondary | ICD-10-CM | POA: Diagnosis not present

## 2018-12-31 DIAGNOSIS — R7303 Prediabetes: Secondary | ICD-10-CM

## 2018-12-31 DIAGNOSIS — E785 Hyperlipidemia, unspecified: Secondary | ICD-10-CM

## 2018-12-31 DIAGNOSIS — F5102 Adjustment insomnia: Secondary | ICD-10-CM

## 2018-12-31 DIAGNOSIS — I2111 ST elevation (STEMI) myocardial infarction involving right coronary artery: Secondary | ICD-10-CM

## 2018-12-31 NOTE — Patient Instructions (Addendum)
Medication Instructions:   TAKE BRILINTA 12 HOURS APART   STOP AFRIN (DO NOT USE NASAL SPRAYS WITH OXYMETAZOINE)  USE SALINE SPRAY-EITHER NASAMIST OR PLAIN SALINE If you need a refill on your cardiac medications before your next appointment, please call your pharmacy.   Lab work: NONE  If you have labs (blood work) drawn today and your tests are completely normal, you will receive your results only by: Marland Kitchen MyChart Message (if you have MyChart) OR . A paper copy in the mail If you have any lab test that is abnormal or we need to change your treatment, we will call you to review the results.  Testing/Procedures: NONE   Follow-Up: At [facility], you and your health needs are our priority.  As part of our continuing mission to provide you with exceptional heart care, we have created designated Provider Care Teams.  These Care Teams include your primary Cardiologist (physician) and Advanced Practice Providers (APPs -  Physician Assistants and Nurse Practitioners) who all work together to provide you with the care you need, when you need it. You will need a follow up appointment in 2 months.  Please call our office 2 months in advance to schedule this appointment.  You may see Quay Burow, MD or one of the following Advanced Practice Providers on your designated Care Team:   Kerin Ransom, PA-C Roby Lofts, [place] . Sande Rives, PA-C  Any Other Special Instructions Will Be Listed Below (If Applicable). CHECK YOUR BLOOD PRESSURE 3-4 TIMES A WEEK AND RECORD IN BLOOD PRESSURE LOG-BRING LOG TO YOUR NEXT APPOINTMENT WORK ON A LOW SODIUM DIET      Quality Sleep Information, Adult Quality sleep is important for your mental and physical health. It also improves your quality of life. Quality sleep means you:  Are asleep for most of the time you are in bed.  Fall asleep within 30 minutes.  Wake up no more than once a night.  Are awake for no longer than 20 minutes if you do wake  up during the night. Most adults need 7-8 hours of quality sleep each night. How can poor sleep affect me? If you do not get enough quality sleep, you may have:  Mood swings.  Daytime sleepiness.  Confusion.  Decreased reaction time.  Sleep disorders, such as insomnia and sleep apnea.  Difficulty with: ? Solving problems. ? Coping with stress. ? Paying attention. These issues may affect your performance and productivity at work, school, and at home. Lack of sleep may also put you at higher risk for accidents, suicide, and risky behaviors. If you do not get quality sleep you may also be at higher risk for several health problems, including:  Infections.  Type 2 diabetes.  Heart disease.  High blood pressure.  Obesity.  Worsening of long-term conditions, like arthritis, kidney disease, depression, Parkinson's disease, and epilepsy. What actions can I take to get more quality sleep?      Stick to a sleep schedule. Go to sleep and wake up at about the same time each day. Do not try to sleep less on weekdays and make up for lost sleep on weekends. This does not work.  Try to get about 30 minutes of exercise on most days. Do not exercise 2-3 hours before going to bed.  Limit naps during the day to 30 minutes or less.  Do not use any products that contain nicotine or tobacco, such as cigarettes or e-cigarettes. If you need help quitting, ask your health  care provider.  Do not drink caffeinated beverages for at least 8 hours before going to bed. Coffee, tea, and some sodas contain caffeine.  Do not drink alcohol close to bedtime.  Do not eat large meals close to bedtime.  Do not take naps in the late afternoon.  Try to get at least 30 minutes of sunlight every day. Morning sunlight is best.  Make time to relax before bed. Reading, listening to music, or taking a hot bath promotes quality sleep.  Make your bedroom a place that promotes quality sleep. Keep your bedroom  dark, quiet, and at a comfortable room temperature. Make sure your bed is comfortable. Take out sleep distractions like TV, a computer, smartphone, and bright lights.  If you are lying awake in bed for longer than 20 minutes, get up and do a relaxing activity until you feel sleepy.  Work with your health care provider to treat medical conditions that may affect sleeping, such as: ? Nasal obstruction. ? Snoring. ? Sleep apnea and other sleep disorders.  Talk to your health care provider if you think any of your prescription medicines may cause you to have difficulty falling or staying asleep.  If you have sleep problems, talk with a sleep consultant. If you think you have a sleep disorder, talk with your health care provider about getting evaluated by a specialist. Where to find more information  [place] website: https://sleepfoundation.org  National Heart, Lung, and [place]): http://www.saunders.info/.pdf  Centers for Disease Control and Prevention (CDC): LearningDermatology.pl Contact a health care provider if you:  Have trouble getting to sleep or staying asleep.  Often wake up very early in the morning and cannot get back to sleep.  Have daytime sleepiness.  Have daytime sleep attacks of suddenly falling asleep and sudden muscle weakness (narcolepsy).  Have a tingling sensation in your legs with a strong urge to move your legs (restless legs syndrome).  Stop breathing briefly during sleep (sleep apnea).  Think you have a sleep disorder or are taking a medicine that is affecting your quality of sleep. Summary  Most adults need 7-8 hours of quality sleep each night.  Getting enough quality sleep is an important part of health and well-being.  Make your bedroom a place that promotes quality sleep and avoid things that may cause you to have poor sleep, such as alcohol, caffeine, smoking, and large meals.   Talk to your health care provider if you have trouble falling asleep or staying asleep. This information is not intended to replace advice given to you by your health care provider. Make sure you discuss any questions you have with your health care provider. Document Released: 06/18/2017 Document Revised: 06/18/2017 Document Reviewed: 06/18/2017 Elsevier Patient Education  2020 [employer].

## 2018-12-31 NOTE — Progress Notes (Signed)
Cardiology Clinic Note   Patient Name: Jasmine Stephens Date of Encounter: 12/31/2018  Primary Care Provider:  Chevis Pretty, [place] Primary Cardiologist:  Quay Burow, MD  Patient Profile    Jasmine Stephens 68 year old female presents today for follow-up status post STEMI.  Past Medical History    Past Medical History:  Diagnosis Date  . Allergy   . Anxiety   . Anxiety and depression   . Arthritis    knee  . Colon polyp   . Depression   . Diverticulosis   . GERD (gastroesophageal reflux disease)   . Irritable bowel syndrome   . STEMI (ST elevation myocardial infarction) ([place]) 12/17/2018   Past Surgical History:  Procedure Laterality Date  . BACK SURGERY    . CHOLECYSTECTOMY    . COLONOSCOPY    . COLONOSCOPY N/A 05/31/2013   Procedure: COLONOSCOPY;  Surgeon: Lafayette Dragon, MD;  Location: [facility];  Service: Endoscopy;  Laterality: N/A;  . CORONARY/GRAFT ACUTE MI REVASCULARIZATION N/A 12/17/2018   Procedure: Coronary/Graft Acute MI Revascularization;  Surgeon: Nelva Bush, MD;  Location: [facility];  Service: Cardiovascular;  Laterality: N/A;  . ESOPHAGOGASTRODUODENOSCOPY N/A 05/31/2013   Procedure: ESOPHAGOGASTRODUODENOSCOPY (EGD);  Surgeon: Lafayette Dragon, MD;  Location: [facility];  Service: Endoscopy;  Laterality: N/A;  . KNEE ARTHROSCOPY Right 10/2016  . LEFT HEART CATH AND CORONARY ANGIOGRAPHY N/A 12/17/2018   Procedure: LEFT HEART CATH AND CORONARY ANGIOGRAPHY;  Surgeon: Nelva Bush, MD;  Location: [facility];  Service: Cardiovascular;  Laterality: N/A;  . MALONEY DILATION  05/31/2013   Procedure: Venia Minks DILATION;  Surgeon: Lafayette Dragon, MD;  Location: [facility];  Service: Endoscopy;;  . PARTIAL HYSTERECTOMY    . REPLACEMENT TOTAL KNEE Right 02/2018  . UPPER GASTROINTESTINAL ENDOSCOPY      Allergies  Allergies  Allergen Reactions  . Compazine [Prochlorperazine] Anaphylaxis  . Phenothiazines Anaphylaxis  . Chlorpromazine Hcl Other  (See Comments)    Reaction unknown  . Penicillins Other (See Comments)    Reaction unkwown Did it involve swelling of the face/tongue/throat, SOB, or low BP? Unknown Did it involve sudden or severe rash/hives, skin peeling, or any reaction on the inside of your mouth or nose? Unknown Did you need to seek medical attention at a hospital or doctor's office? Unknown When did it last happen? unk If all above answers are "NO", may proceed with cephalosporin use.   . Latex Rash    With continued exposure to latex  . Tape Rash  . Tapentadol Rash    History of Present Illness    [name] was discharged from [facility] on 12/07/2018.  She presented to the emergency department on 12/17/2018 after developing sudden chest pain that she described as a 9 out of 10 pain.  She underwent cardiac catheterization and was found to have a occluded mid RCA lesion that received a DES x1, she also had a 30% lesion of her LAD and no other significant coronary disease.  Her follow-up echocardiogram on 12/18/2018 showed a estimated LVEF of 60 to 65%, and mild mitral valve regurgitation.  Her PMH also includes GERD, diverticulosis, IBS, dyslipidemia, anxiety, moderate obesity, and prediabetes.  She presents to the clinic today and states she feels well.  She has slowly progressed into her normal daily activities.  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  She states this is very stressful at times.  She also states that she has been having some  nasal congestion and has been using Afrin nasal spray.  When asked why her blood pressure has been running at home she states that they have been averaging 120s over 60s.  She states she used her nasal spray about  30 minutes to 1 hour prior to her appointment this morning.  She also states that she is in the process of retiring does not have a consistent work schedule like she used to and is having trouble sleeping.  She denies chest  pain, shortness of breath, lower extremity edema, fatigue, palpitations, melena, hematuria, hemoptysis, diaphoresis, weakness, presyncope, syncope, orthopnea, and PND.   Home Medications    Prior to Admission medications   Medication Sig Start Date End Date Taking? Authorizing Provider  acetaminophen (TYLENOL) 500 MG tablet Take 500 mg by mouth every 6 (six) hours as needed for mild pain or headache.     [provider]  aspirin 81 MG chewable tablet Chew 1 tablet (81 mg total) by mouth daily. 12/20/18   Erlene Quan, PA-C  atorvastatin (LIPITOR) 80 MG tablet Take 1 tablet (80 mg total) by mouth daily at 6 PM. 12/05/2018   Kilroy, Doreene Burke, PA-C  losartan (COZAAR) 25 MG tablet Take 1 tablet (25 mg total) by mouth daily. 12/20/18   Erlene Quan, PA-C  meloxicam (MOBIC) 15 MG tablet Take 15 mg by mouth daily as needed (knee pain).  09/08/18   [provider]  metoprolol tartrate (LOPRESSOR) 25 MG tablet Take 0.5 tablets (12.5 mg total) by mouth 2 (two) times daily. 12/08/2018   Erlene Quan, PA-C  Multiple Vitamin (MULTIVITAMIN WITH MINERALS) TABS tablet Take 1 tablet by mouth daily.    [provider]  nitroGLYCERIN (NITROSTAT) 0.4 MG SL tablet Place 1 tablet (0.4 mg total) under the tongue every 5 (five) minutes x 3 doses as needed for chest pain. 12/23/2018   Erlene Quan, PA-C  Omega-3 Fatty Acids (FISH OIL PO) Take 2 capsules by mouth daily.    [provider]  pantoprazole (PROTONIX) 40 MG tablet Take 1 tablet (40 mg total) by mouth daily. 12/20/18   Erlene Quan, PA-C  ticagrelor (BRILINTA) 90 MG TABS tablet Take 1 tablet (90 mg total) by mouth 2 (two) times daily. 12/18/2018   Erlene Quan, PA-C    Family History    Family History  Problem Relation Age of Onset  . Heart failure Mother 50  . Heart disease Father 6  . Colon cancer Paternal Grandmother   . Heart disease Brother   . Colon cancer Paternal Aunt    She indicated that her mother is deceased.  She indicated that her father is deceased. She indicated that the status of her brother is unknown. She indicated that her maternal grandmother is deceased. She indicated that her maternal grandfather is deceased. She indicated that her paternal grandmother is deceased. She indicated that her paternal grandfather is deceased. She indicated that the status of her paternal aunt is unknown.  Social History    Social History   Socioeconomic History  . Marital status: Married    Spouse name: Not on file  . Number of children: 3  . Years of education: Not on file  . Highest education level: Not on file  Occupational History  . Occupation: Retired  Scientific laboratory technician  . Financial resource strain: Not hard at all  . Food insecurity    Worry: Never true    Inability: Not on file  . Transportation needs  Medical: Not on file    Non-medical: Not on file  Tobacco Use  . Smoking status: Never Smoker  . Smokeless tobacco: Never Used  Substance and Sexual Activity  . Alcohol use: Yes    Comment: occasional wine  . Drug use: No  . Sexual activity: Not on file  Lifestyle  . Physical activity    Days per week: Not on file    Minutes per session: Not on file  . Stress: Not on file  Relationships  . Social Herbalist on phone: Not on file    Gets together: Not on file    Attends religious service: Not on file    Active member of club or organization: Not on file    Attends meetings of clubs or organizations: Not on file    Relationship status: Not on file  . Intimate partner violence    Fear of current or ex partner: Not on file    Emotionally abused: Not on file    Physically abused: Not on file    Forced sexual activity: Not on file  Other Topics Concern  . Not on file  Social History Narrative   Lives in [place].      Review of Systems    General:  No chills, fever, night sweats or weight changes.  Cardiovascular:  No chest pain, dyspnea on exertion, edema, orthopnea,  palpitations, paroxysmal nocturnal dyspnea. Dermatological: No rash, lesions/masses Respiratory: No cough, dyspnea Urologic: No hematuria, dysuria Abdominal:   No nausea, vomiting, diarrhea, bright red blood per rectum, melena, or hematemesis Neurologic:  No visual changes, wkns, changes in mental status. All other systems reviewed and are otherwise negative except as noted above.  Physical Exam    VS:  BP 140/66   Pulse (!) 52   Temp (!) 97.2 F (36.2 C) (Temporal)   Ht 5' 6.5" (1.689 m)   Wt 215 lb (97.5 kg)   LMP  (LMP Unknown)   SpO2 94%   BMI 34.18 kg/m  , BMI Body mass index is 34.18 kg/m. GEN: Well nourished, well developed, in no acute distress. HEENT: normal. Neck: Supple, no JVD, carotid bruits, or masses. Cardiac: RRR, no murmurs, rubs, or gallops. No clubbing, cyanosis, edema.  Radials/DP/PT 2+ and equal bilaterally.  Respiratory:  Respirations regular and unlabored, clear to auscultation bilaterally. GI: Soft, nontender, nondistended, BS + x 4. MS: no deformity or atrophy. Skin: warm and dry, no rash.  Right radial catheterization site clean, dry, intact, no erythema or drainage noted. Neuro:  Strength and sensation are intact. Psych: Normal affect.  Accessory Clinical Findings    ECG personally reviewed by me today-sinus bradycardia with an incomplete right bundle branch block inferior infarct undetermined age 68 bpm- No acute changes  EKG 12/05/2018 Sinus bradycardia 58 bpm  Cardiac catheterization 12/17/2018 1. Severe single-vessel coronary artery disease with 100% thrombotic occlusion of the mid RCA.  Mild proximal and mid LAD disease is also noted.  Transient vasospasm occurred in the RCA as well, which resolved with intracoronary nitroglycerin. 2. Low normal left ventricular systolic function with basal and mid inferior hypokinesis.  LVEF 50-55%. 3. Normal left ventricular filling pressure. 4. Successful PCI to proximal and mid RCA with overlapping Synergy  2.75 x 20 mm (proximal) and Synergy 2.75 x 28 mm (distal) drug-eluting stents with 0% residual stenosis and TIMI-3 flow.  A second stent was needed due to plaque shift versus focal dissection at the proximal edge of the more distal  stent.  Recommendations: 1. Admit to 2-Heart ICU for post STEMI care. 2. Dual antiplatelet therapy with aspirin and ticagrelor for at least 12 months. 3. Aggressive secondary prevention, including high intensity statin therapy.  Echocardiogram 12/18/2018 1. Left ventricular ejection fraction, by visual estimation, is 60 to 65%. The left ventricle has normal function. Normal left ventricular size. There is no left ventricular hypertrophy.  2. Global right ventricle has normal systolic function.The right ventricular size is normal. No increase in right ventricular wall thickness.  3. The mitral valve is normal in structure. Mild mitral valve regurgitation.  Assessment & Plan   STEMI- cardiac catheterization with PCI to RCA with DES x1. Continue aspirin 81 mg tablet daily Continue losartan 25 mg tablet daily Continue metoprolol tartrate 12.5 mg tablet twice daily Continue nitroglycerin 0.4 mg sublingual tablet as needed Continue ticagrelor 90 mg tablet twice daily Heart healthy low-sodium diet-salty 6 given Increase physical activity as tolerated  Instructed to stop using Afrin nasal spray and products with oxymetazoline.  Educated that it is okay to use saline nasal rinse/spray.  Dyslipidemia-12/18/2018: Cholesterol 211; HDL 42; LDL Cholesterol 147; Triglycerides 109; VLDL 22 Continue atorvastatin 80 mg tablet daily  Moderate obesity-BMI 34.44-weight today 215 pounds up from 200 pounds on 10/08/2018. Continue weight loss  Prediabetes-A1c 6.0 12/17/2018 2 Heart healthy low-sodium carb modified Increase physical activity as tolerated Continue weight loss  Insomnia-states that since returning home from the hospital she has had more trouble sleeping. Given  instructions for sleep hygiene practices. Educated about limiting caffeine use, avoiding exercise 2 to 3 hours before bed, limiting screen time prior to bed, and waking up and going to bed at the same time each night.  Disposition: Follow-up with Dr. Gwenlyn Found in 2 months  Deberah Pelton, NP-C 12/31/2018, 11:43 AM

## 2019-01-14 ENCOUNTER — Encounter (INDEPENDENT_AMBULATORY_CARE_PROVIDER_SITE_OTHER): Payer: Self-pay

## 2019-02-23 DIAGNOSIS — U071 COVID-19: Secondary | ICD-10-CM

## 2019-02-23 HISTORY — DX: COVID-19: U07.1

## 2019-03-02 ENCOUNTER — Telehealth: Payer: Medicare Other | Admitting: Cardiovascular Disease

## 2019-03-09 ENCOUNTER — Other Ambulatory Visit: Payer: Self-pay

## 2019-03-09 ENCOUNTER — Encounter: Payer: Self-pay | Admitting: Cardiovascular Disease

## 2019-03-09 ENCOUNTER — Ambulatory Visit (INDEPENDENT_AMBULATORY_CARE_PROVIDER_SITE_OTHER): Payer: Medicare Other | Admitting: Cardiovascular Disease

## 2019-03-09 DIAGNOSIS — I2111 ST elevation (STEMI) myocardial infarction involving right coronary artery: Secondary | ICD-10-CM | POA: Diagnosis not present

## 2019-03-09 DIAGNOSIS — E785 Hyperlipidemia, unspecified: Secondary | ICD-10-CM | POA: Diagnosis not present

## 2019-03-09 NOTE — Progress Notes (Signed)
03/09/2019 Jasmine Stephens   Apr 09, 1950  [id number]  Primary Physician Hassell Done Mary-Margaret, FNP Primary Cardiologist: Lorretta Harp MD Garret Reddish, [place], [place]  HPI:  Jasmine Stephens is a 68 y.o. 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  Her mother was Mollie Germany, a patient of mine who died in 07-03-06 and 9.  I am being seen at her request because of my relationship with her mother for her first MD post hospital visit.  Her primary provider is Ronnald Collum, NP at [facility].  Her risk factors include treated hypertension hyperlipidemia.  She has a strong family history with a father who had myocardial infarction and died.  Mother had CAD and a brother had an MI in his 18s.  She had inferior STEMI 12/17/2018 treated with PCI and overlapping drug-eluting stents in her mid RCA by Dr. Saunders Revel with otherwise no significant remaining CAD and normal global LV function with an inferior wall motion abnormality.  She was discharged home 2 days later on low-dose aspirin, Brilinta, high-dose statin therapy, losartan and beta-blocker.  She is had no recurrent symptoms.   Current Meds  Medication Sig  . acetaminophen (TYLENOL) 500 MG tablet Take 500 mg by mouth every 6 (six) hours as needed for mild pain or headache.   Marland Kitchen aspirin 81 MG chewable tablet Chew 1 tablet (81 mg total) by mouth daily.  Marland Kitchen atorvastatin (LIPITOR) 80 MG tablet Take 1 tablet (80 mg total) by mouth daily at 6 PM.  . losartan (COZAAR) 25 MG tablet Take 1 tablet (25 mg total) by mouth daily.  . meloxicam (MOBIC) 15 MG tablet Take 15 mg by mouth daily as needed (knee pain).   . metoprolol tartrate (LOPRESSOR) 25 MG tablet Take 0.5 tablets (12.5 mg total) by mouth 2 (two) times daily.  . Multiple Vitamin (MULTIVITAMIN WITH MINERALS) TABS tablet Take 1 tablet by mouth daily.  . nitroGLYCERIN (NITROSTAT)  0.4 MG SL tablet Place 1 tablet (0.4 mg total) under the tongue every 5 (five) minutes x 3 doses as needed for chest pain.  . Omega-3 Fatty Acids (FISH OIL PO) Take 2 capsules by mouth daily.  . pantoprazole (PROTONIX) 40 MG tablet Take 1 tablet (40 mg total) by mouth daily.  . ticagrelor (BRILINTA) 90 MG TABS tablet Take 1 tablet (90 mg total) by mouth 2 (two) times daily.     Allergies  Allergen Reactions  . Compazine [Prochlorperazine] Anaphylaxis  . Phenothiazines Anaphylaxis  . Chlorpromazine Hcl Other (See Comments)    Reaction unknown  . Penicillins Other (See Comments)    Reaction unkwown Did it involve swelling of the face/tongue/throat, SOB, or low BP? Unknown Did it involve sudden or severe rash/hives, skin peeling, or any reaction on the inside of your mouth or nose? Unknown Did you need to seek medical attention at a hospital or doctor's office? Unknown When did it last happen? unk If all above answers are "NO", may proceed with cephalosporin use.   . Latex Rash    With continued exposure to latex  . Tape Rash  . Tapentadol Rash    Social History   Socioeconomic History  . Marital status: Married    Spouse name: Not on file  . Number of children: 3  . Years of education: Not on file  . Highest education level: Not on file  Occupational History  . Occupation:  Retired  Tobacco Use  . Smoking status: Never Smoker  . Smokeless tobacco: Never Used  Substance and Sexual Activity  . Alcohol use: Yes    Comment: occasional wine  . Drug use: No  . Sexual activity: Not on file  Other Topics Concern  . Not on file  Social History Narrative   Lives in [place].    Social Determinants of Health   Financial Resource Strain: Low Risk   . Difficulty of Paying Living Expenses: Not hard at all  Food Insecurity: Unknown  . Worried About Charity fundraiser in the Last Year: Never true  . Ran Out of Food in the Last Year: Not on file  Transportation Needs:   . Lack  of Transportation (Medical): Not on file  . Lack of Transportation (Non-Medical): Not on file  Physical Activity:   . Days of Exercise per Week: Not on file  . Minutes of Exercise per Session: Not on file  Stress:   . Feeling of Stress : Not on file  Social Connections:   . Frequency of Communication with Friends and Family: Not on file  . Frequency of Social Gatherings with Friends and Family: Not on file  . Attends Religious Services: Not on file  . Active Member of Clubs or Organizations: Not on file  . Attends Archivist Meetings: Not on file  . Marital Status: Not on file  Intimate Partner Violence:   . Fear of Current or Ex-Partner: Not on file  . Emotionally Abused: Not on file  . Physically Abused: Not on file  . Sexually Abused: Not on file     Review of Systems: General: negative for chills, fever, night sweats or weight changes.  Cardiovascular: negative for chest pain, dyspnea on exertion, edema, orthopnea, palpitations, paroxysmal nocturnal dyspnea or shortness of breath Dermatological: negative for rash Respiratory: negative for cough or wheezing Urologic: negative for hematuria Abdominal: negative for nausea, vomiting, diarrhea, bright red blood per rectum, melena, or hematemesis Neurologic: negative for visual changes, syncope, or dizziness All other systems reviewed and are otherwise negative except as noted above.    Blood pressure 136/71, pulse 69, temperature (!) 97.1 F (36.2 C), height 5\' 6"  (1.676 m), weight 220 lb (99.8 kg).  General appearance: alert and no distress Neck: no adenopathy, no carotid bruit, no JVD, supple, symmetrical, trachea midline and thyroid not enlarged, symmetric, no tenderness/mass/nodules Lungs: clear to auscultation bilaterally Heart: regular rate and rhythm, S1, S2 normal, no murmur, click, rub or gallop Extremities: extremities normal, atraumatic, no cyanosis or edema Pulses: 2+ and symmetric Skin: Skin color,  texture, turgor normal. No rashes or lesions Neurologic: Alert and oriented X 3, normal strength and tone. Normal symmetric reflexes. Normal coordination and gait  EKG not performed today  ASSESSMENT AND PLAN:   Dyslipidemia, goal LDL below 70 History of dyslipidemia with an initial LDL of 147.  High-dose statin therapy was added.  We will recheck a lipid liver profile.  STEMI involving right coronary artery [facility]) History of inferior STEMI 12/17/2018 treated with PCI and overlapping drug-eluting stents by Dr. Saunders Revel.  She had no other significant CAD.  She did have a for wall motion abnormality but 2D echo revealed normal LV function.  She is on aspirin and Brilinta.      Lorretta Harp MD FACP,FACC,FAHA, [facility] 03/09/2019 9:27 AM

## 2019-03-09 NOTE — Assessment & Plan Note (Signed)
History of dyslipidemia with an initial LDL of 147.  High-dose statin therapy was added.  We will recheck a lipid liver profile.

## 2019-03-09 NOTE — Assessment & Plan Note (Signed)
History of inferior STEMI 12/17/2018 treated with PCI and overlapping drug-eluting stents by Dr. Saunders Revel.  She had no other significant CAD.  She did have a for wall motion abnormality but 2D echo revealed normal LV function.  She is on aspirin and Brilinta.

## 2019-03-09 NOTE — Patient Instructions (Signed)
Medication Instructions:  Your physician recommends that you continue on your current medications as directed. Please refer to the Current Medication list given to you today.  If you need a refill on your cardiac medications before your next appointment, please call your pharmacy.   Lab work: Fasting Lipids and Hepatic Function If you have labs (blood work) drawn today and your tests are completely normal, you will receive your results only by: MyChart Message (if you have MyChart) OR A paper copy in the mail If you have any lab test that is abnormal or we need to change your treatment, we will call you to review the results.  Testing/Procedures: NONE  Follow-Up: At [facility], you and your health needs are our priority.  As part of our continuing mission to provide you with exceptional heart care, we have created designated Provider Care Teams.  These Care Teams include your primary Cardiologist (physician) and Advanced Practice Providers (APPs -  Physician Assistants and Nurse Practitioners) who all work together to provide you with the care you need, when you need it. You may see Jonathan Berry, MD  or one of the following Advanced Practice Providers on your designated Care Team:    Luke Kilroy, PA-C  Callie Goodrich, PA-C  Jesse Cleaver, FNP Your physician wants you to follow-up in: 1 year.         

## 2019-03-29 ENCOUNTER — Telehealth: Payer: Self-pay | Admitting: Cardiovascular Disease

## 2019-03-29 ENCOUNTER — Telehealth: Payer: Self-pay | Admitting: Nurse Practitioner

## 2019-03-29 NOTE — Telephone Encounter (Signed)
Patient aware and verbalized understanding. °

## 2019-03-29 NOTE — Telephone Encounter (Signed)
Pt states she went to [facility] 03/25/19 and had rapid COVID-19 test which came back positive. She has a headache with congestion and coughing but no fever. Pt is taking tylenol and her cough has increased and no energy.She isn't taking any otc cough suppressant or anything for congestion. Should she be doing anything else?

## 2019-03-29 NOTE — Telephone Encounter (Signed)
Spoke with the patient. She stated that she started having symptoms on December 26th and tested positive for Covid on 03/25/2019.  Her and her husband both have it. She has been advised to stay in contact with her PCP and keep them updated on her symptoms.

## 2019-03-29 NOTE — Telephone Encounter (Signed)
Continue current plan and if worsens let us know.

## 2019-03-29 NOTE — Telephone Encounter (Signed)
New Message     Pt is calling to let her Cardiologist know that she is COVID positive as of 03/25/19    Please call

## 2019-04-02 ENCOUNTER — Encounter: Payer: Self-pay | Admitting: Family Medicine

## 2019-04-02 ENCOUNTER — Ambulatory Visit (INDEPENDENT_AMBULATORY_CARE_PROVIDER_SITE_OTHER): Payer: BC Managed Care – PPO | Admitting: Family Medicine

## 2019-04-02 DIAGNOSIS — U071 COVID-19: Secondary | ICD-10-CM

## 2019-04-02 MED ORDER — HYDROCODONE-HOMATROPINE 5-1.5 MG/5ML PO SYRP: 5.0000 mL | ORAL_SOLUTION | Freq: Four times a day (QID) | ORAL | 0 refills | Status: DC | PRN

## 2019-04-02 MED ORDER — DEXAMETHASONE 6 MG PO TABS: 6.0000 mg | ORAL_TABLET | Freq: Every day | ORAL | 0 refills | Status: AC

## 2019-04-02 NOTE — Progress Notes (Addendum)
Virtual Visit via Telephone Note  I connected with Jasmine Stephens on 04/02/19 at 1:30 PM by telephone and verified that I am speaking with the correct person using two identifiers. Jasmine Stephens is currently located at home and her husband is currently with her during this visit, which she is okay with. The provider, Loman Brooklyn, FNP is located in their home at time of visit.  I discussed the limitations, risks, security and privacy concerns of performing an evaluation and management service by telephone and the availability of in person appointments. I also discussed with the patient that there may be a patient responsible charge related to this service. The patient expressed understanding and agreed to proceed.  Subjective: PCP: Chevis Pretty, FNP  Chief Complaint  Patient presents with  . URI   Patient complains of cough, head/chest congestion, headache, ear pain/pressure, facial pain/pressure, postnasal drainage and fatigue. Onset of symptoms was 2 weeks ago, gradually improving since that time. She is drinking plenty of fluids. Evaluation to date: none. Treatment to date: cough suppressants and Tylenol. She has a history of allergies. She does not smoke.  Patient was diagnosed with COVID-19 on 03/25/2019.   ROS: Per HPI  Current Outpatient Medications:  .  acetaminophen (TYLENOL) 500 MG tablet, Take 500 mg by mouth every 6 (six) hours as needed for mild pain or headache. , Disp: , Rfl:  .  aspirin 81 MG chewable tablet, Chew 1 tablet (81 mg total) by mouth daily., Disp:  , Rfl:  .  atorvastatin (LIPITOR) 80 MG tablet, Take 1 tablet (80 mg total) by mouth daily at 6 PM., Disp: 90 tablet, Rfl: 3 .  dexamethasone (DECADRON) 6 MG tablet, Take 1 tablet (6 mg total) by mouth daily for 5 days., Disp: 5 tablet, Rfl: 0 .  HYDROcodone-homatropine (HYCODAN) 5-1.5 MG/5ML syrup, Take 5 mLs by mouth every 6 (six) hours as needed for cough., Disp: 120 mL, Rfl: 0 .  losartan (COZAAR) 25 MG  tablet, Take 1 tablet (25 mg total) by mouth daily., Disp: 90 tablet, Rfl: 3 .  meloxicam (MOBIC) 15 MG tablet, Take 15 mg by mouth daily as needed (knee pain). , Disp: , Rfl:  .  metoprolol tartrate (LOPRESSOR) 25 MG tablet, Take 0.5 tablets (12.5 mg total) by mouth 2 (two) times daily., Disp: 90 tablet, Rfl: 3 .  Multiple Vitamin (MULTIVITAMIN WITH MINERALS) TABS tablet, Take 1 tablet by mouth daily., Disp: , Rfl:  .  nitroGLYCERIN (NITROSTAT) 0.4 MG SL tablet, Place 1 tablet (0.4 mg total) under the tongue every 5 (five) minutes x 3 doses as needed for chest pain., Disp: 25 tablet, Rfl: 3 .  Omega-3 Fatty Acids (FISH OIL PO), Take 2 capsules by mouth daily., Disp: , Rfl:  .  pantoprazole (PROTONIX) 40 MG tablet, Take 1 tablet (40 mg total) by mouth daily., Disp: 90 tablet, Rfl: 3 .  ticagrelor (BRILINTA) 90 MG TABS tablet, Take 1 tablet (90 mg total) by mouth 2 (two) times daily., Disp: 180 tablet, Rfl: 3  Allergies  Allergen Reactions  . Compazine [Prochlorperazine] Anaphylaxis  . Phenothiazines Anaphylaxis  . Chlorpromazine Hcl Other (See Comments)    Reaction unknown  . Penicillins Other (See Comments)    Reaction unkwown Did it involve swelling of the face/tongue/throat, SOB, or low BP? Unknown Did it involve sudden or severe rash/hives, skin peeling, or any reaction on the inside of your mouth or nose? Unknown Did you need to seek medical attention at a hospital or  doctor's office? Unknown When did it last happen? unk If all above answers are "NO", may proceed with cephalosporin use.   . Latex Rash    With continued exposure to latex  . Tape Rash  . Tapentadol Rash   Past Medical History:  Diagnosis Date  . Allergy   . Anxiety   . Anxiety and depression   . Arthritis    knee  . Colon polyp   . COVID-19 02/2019  . Depression   . Diverticulosis   . GERD (gastroesophageal reflux disease)   . Irritable bowel syndrome   . STEMI (ST elevation myocardial infarction)  ([place]) 12/17/2018    Observations/Objective: A&O  No respiratory distress or wheezing audible over the phone Mood, judgement, and thought processes all WNL  Assessment and Plan: 1. COVID-19 - Discussed symptom management.  Mucinex twice daily with a full glass of water.  Hycodan for cough; we did discuss that she should not suppress her cough during the day so that she can get stuff up.  She will primarily use it at night so that she is able to get some sleep.  She is already taking vitamin C, vitamin D, and zinc.  Encouraged adequate hydration. - dexamethasone (DECADRON) 6 MG tablet; Take 1 tablet (6 mg total) by mouth daily for 5 days.  Dispense: 5 tablet; Refill: 0 - HYDROcodone-homatropine (HYCODAN) 5-1.5 MG/5ML syrup; Take 5 mLs by mouth every 6 (six) hours as needed for cough.  Dispense: 120 mL; Refill: 0   Follow Up Instructions:  I discussed the assessment and treatment plan with the patient. The patient was provided an opportunity to ask questions and all were answered. The patient agreed with the plan and demonstrated an understanding of the instructions.   The patient was advised to call back or seek an in-person evaluation if the symptoms worsen or if the condition fails to improve as anticipated.  The above assessment and management plan was discussed with the patient. The patient verbalized understanding of and has agreed to the management plan. Patient is aware to call the clinic if symptoms persist or worsen. Patient is aware when to return to the clinic for a follow-up visit. Patient educated on when it is appropriate to go to the emergency department.   Time call ended: 1:47 PM  I provided 19 minutes of non-face-to-face time during this encounter.  Hendricks Limes, MSN, APRN, FNP-C [facility] 04/02/19

## 2019-05-25 ENCOUNTER — Other Ambulatory Visit: Payer: Self-pay | Admitting: Cardiology

## 2019-06-17 ENCOUNTER — Telehealth: Payer: Self-pay | Admitting: Cardiovascular Disease

## 2019-06-17 NOTE — Telephone Encounter (Signed)
Spoke with pt who state that she is scheduled for her vaccine today but scared. She is asking if Dr. Gwenlyn Found feels she is ok to take it as a cardiac pt. Pt informed that that providers are encouraging pt to take it. Pt verbalized understanding.

## 2019-06-17 NOTE — Telephone Encounter (Signed)
Sadeen is calling stating she is scheduled to get her Covid Vaccine today at 4:30 PM. She states she is scared to death to get it and would like Dr. Kennon Holter input on it before going. I advised her our Cardiologist are suggesting all patient's get the vaccine, but she stated she would like to speak with a nurse in regards to it due to her already having Covid and being allergic to certain things. Please advise.

## 2019-06-30 ENCOUNTER — Other Ambulatory Visit: Payer: Self-pay | Admitting: Cardiology

## 2019-09-10 NOTE — Progress Notes (Signed)
Cardiology Clinic Note   Patient Name: Deangela Hueber Date of Encounter: 09/14/2019  Primary Care Provider:  Chevis Pretty, [place] Primary Cardiologist:  Quay Burow, MD  Patient Profile    Shandora Sinha 69 year old female presents today for a evaluation of her coronary artery disease, and dyslipidemia.  Past Medical History    Past Medical History:  Diagnosis Date  . Allergy   . Anxiety   . Anxiety and depression   . Arthritis    knee  . Colon polyp   . COVID-19 02/2019  . Depression   . Diverticulosis   . GERD (gastroesophageal reflux disease)   . Irritable bowel syndrome   . STEMI (ST elevation myocardial infarction) ([place] [place]) 12/17/2018   Past Surgical History:  Procedure Laterality Date  . BACK SURGERY    . CHOLECYSTECTOMY    . COLONOSCOPY    . COLONOSCOPY N/A 05/31/2013   Procedure: COLONOSCOPY;  Surgeon: Lafayette Dragon, MD;  Location: [facility];  Service: Endoscopy;  Laterality: N/A;  . CORONARY/GRAFT ACUTE MI REVASCULARIZATION N/A 12/17/2018   Procedure: Coronary/Graft Acute MI Revascularization;  Surgeon: Nelva Bush, MD;  Location: [facility];  Service: Cardiovascular;  Laterality: N/A;  . ESOPHAGOGASTRODUODENOSCOPY N/A 05/31/2013   Procedure: ESOPHAGOGASTRODUODENOSCOPY (EGD);  Surgeon: Lafayette Dragon, MD;  Location: [facility];  Service: Endoscopy;  Laterality: N/A;  . KNEE ARTHROSCOPY Right 10/2016  . LEFT HEART CATH AND CORONARY ANGIOGRAPHY N/A 12/17/2018   Procedure: LEFT HEART CATH AND CORONARY ANGIOGRAPHY;  Surgeon: Nelva Bush, MD;  Location: [facility];  Service: Cardiovascular;  Laterality: N/A;  . MALONEY DILATION  05/31/2013   Procedure: Venia Minks DILATION;  Surgeon: Lafayette Dragon, MD;  Location: [facility];  Service: Endoscopy;;  . PARTIAL HYSTERECTOMY    . REPLACEMENT TOTAL KNEE Right 02/2018  . UPPER GASTROINTESTINAL ENDOSCOPY      Allergies  Allergies  Allergen Reactions  . Compazine [Prochlorperazine] Anaphylaxis  .  Phenothiazines Anaphylaxis  . Chlorpromazine Hcl Other (See Comments)    Reaction unknown  . Penicillins Other (See Comments)    Reaction unkwown Did it involve swelling of the face/tongue/throat, SOB, or low BP? Unknown Did it involve sudden or severe rash/hives, skin peeling, or any reaction on the inside of your mouth or nose? Unknown Did you need to seek medical attention at a hospital or doctor's office? Unknown When did it last happen? unk If all above answers are "NO", may proceed with cephalosporin use.   . Latex Rash    With continued exposure to latex  . Tape Rash  . Tapentadol Rash    History of Present Illness    Ms. Teresi was discharged from [facility] on 12/23/2018.  She presented to the emergency department on 12/17/2018 after developing sudden chest pain that she described as a 9 out of 10 pain.  She underwent cardiac catheterization and was found to have a occluded mid RCA lesion that received a DES x1, she also had a 30% lesion of her LAD and no other significant coronary disease.  Her follow-up echocardiogram on 12/18/2018 showed a estimated LVEF of 60 to 65%, and mild mitral valve regurgitation.  Her PMH also includes GERD, diverticulosis, IBS, dyslipidemia, anxiety, moderate obesity, and prediabetes.  She presented to the clinic 12/31/2018 and stated she felt well.  She had slowly progressed into her normal daily activities.  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  She stated it was very stressful at times.  She also stated that she had been having some nasal congestion and had been using Afrin nasal spray.  When asked if her blood pressure had been running at home she states that they have been averaging 120s over 60s.  She stated she used her nasal spray about  30 minutes to 1 hour prior to her appointment this morning.  She also stated that she was in the process of retiring and did not have a consistent work schedule like  she used to and was having trouble sleeping.  She saw Dr. Gwenlyn Found on 03/09/2019.  She was doing well at that time, was tolerating her medications, and had no cardiac complaints.  She presents to the clinic today and states she feels well today.  She has had intermittent episodes of right-sided chest pain which sounds muscular in nature.  This was related to roughhousing with her grandkids and was nonexertional in nature.  She also noted some right-sided arm and chest pain after her second Covid vaccination.  She has been somewhat sedentary due to pain in her right knee over the last 3 weeks.  She also stated that she ran out of her metoprolol tartrate and has not taken it yet today.  She has had the medication refilled and states that her blood pressure was well controlled at home with systolics in the 254Y.  She has changed her diet and is using more herbs and spices as opposed to salting her food.  She indicates that she is going to start increase her physical activity as well.  I will give her the salty 6 information sheet, encourage her to increase her physical activity, and have her follow-up with Dr. Gwenlyn Found in 6 months.  Also order fasting lipid panel and LFTs.  Today she denies chest pain, shortness of breath, lower extremity edema, fatigue, palpitations, melena, hematuria, hemoptysis, diaphoresis, weakness, presyncope, syncope, orthopnea, and PND.  Home Medications    Prior to Admission medications   Medication Sig Start Date End Date Taking? Authorizing Provider  acetaminophen (TYLENOL) 500 MG tablet Take 500 mg by mouth every 6 (six) hours as needed for mild pain or headache.     [provider]  aspirin 81 MG chewable tablet Chew 1 tablet (81 mg total) by mouth daily. 12/20/18   Erlene Quan, PA-C  atorvastatin (LIPITOR) 80 MG tablet Take 1 tablet (80 mg total) by mouth daily at 6 PM. 12/05/2018   Kilroy, Doreene Burke, PA-C  HYDROcodone-homatropine [facility]) 5-1.5 MG/5ML syrup Take 5 mLs by  mouth every 6 (six) hours as needed for cough. 04/02/19   Loman Brooklyn, FNP  losartan (COZAAR) 25 MG tablet Take 1 tablet (25 mg total) by mouth daily. 12/20/18   Erlene Quan, PA-C  meloxicam (MOBIC) 15 MG tablet Take 15 mg by mouth daily as needed (knee pain).  09/08/18   [provider]  metoprolol tartrate (LOPRESSOR) 25 MG tablet TAKE 1/2 TABLET TWICE DAILY 07/01/19   Erlene Quan, PA-C  Multiple Vitamin (MULTIVITAMIN WITH MINERALS) TABS tablet Take 1 tablet by mouth daily.    [provider]  nitroGLYCERIN (NITROSTAT) 0.4 MG SL tablet Place 1 tablet (0.4 mg total) under the tongue every 5 (five) minutes x 3 doses as needed for chest pain. 12/07/2018   Erlene Quan, PA-C  Omega-3 Fatty Acids (FISH OIL PO) Take 2 capsules by mouth daily.    [provider]  pantoprazole (PROTONIX) 40 MG tablet Take 1 tablet (40 mg total) by  mouth daily. 12/20/18   Erlene Quan, PA-C  ticagrelor (BRILINTA) 90 MG TABS tablet Take 1 tablet (90 mg total) by mouth 2 (two) times daily. 11/27/2018   Erlene Quan, PA-C    Family History    Family History  Problem Relation Age of Onset  . Heart failure Mother 36  . Heart disease Father 75  . Colon cancer Paternal Grandmother   . Heart disease Brother   . Colon cancer Paternal Aunt    She indicated that her mother is deceased. She indicated that her father is deceased. She indicated that the status of her brother is unknown. She indicated that her maternal grandmother is deceased. She indicated that her maternal grandfather is deceased. She indicated that her paternal grandmother is deceased. She indicated that her paternal grandfather is deceased. She indicated that the status of her paternal aunt is unknown.  Social History    Social History   Socioeconomic History  . Marital status: Married    Spouse name: Not on file  . Number of children: 3  . Years of education: Not on file  . Highest education level: Not on file    Occupational History  . Occupation: Retired  Tobacco Use  . Smoking status: Never Smoker  . Smokeless tobacco: Never Used  Vaping Use  . Vaping Use: Never used  Substance and Sexual Activity  . Alcohol use: Yes    Comment: occasional wine  . Drug use: No  . Sexual activity: Not on file  Other Topics Concern  . Not on file  Social History Narrative   Lives in [place].    Social Determinants of Health   Financial Resource Strain: Low Risk   . Difficulty of Paying Living Expenses: Not hard at all  Food Insecurity: Unknown  . Worried About Charity fundraiser in the Last Year: Never true  . Ran Out of Food in the Last Year: Not on file  Transportation Needs:   . Lack of Transportation (Medical):   Marland Kitchen Lack of Transportation (Non-Medical):   Physical Activity:   . Days of Exercise per Week:   . Minutes of Exercise per Session:   Stress:   . Feeling of Stress :   Social Connections:   . Frequency of Communication with Friends and Family:   . Frequency of Social Gatherings with Friends and Family:   . Attends Religious Services:   . Active Member of Clubs or Organizations:   . Attends Archivist Meetings:   Marland Kitchen Marital Status:   Intimate Partner Violence:   . Fear of Current or Ex-Partner:   . Emotionally Abused:   Marland Kitchen Physically Abused:   . Sexually Abused:      Review of Systems    General:  No chills, fever, night sweats or weight changes.  Cardiovascular:  No chest pain, dyspnea on exertion, edema, orthopnea, palpitations, paroxysmal nocturnal dyspnea. Dermatological: No rash, lesions/masses Respiratory: No cough, dyspnea Urologic: No hematuria, dysuria Abdominal:   No nausea, vomiting, diarrhea, bright red blood per rectum, melena, or hematemesis Neurologic:  No visual changes, wkns, changes in mental status. All other systems reviewed and are otherwise negative except as noted above.  Physical Exam    VS:  BP 132/70   Pulse 77   Ht 5' 6.5" (1.689 m)    Wt 225 lb (102.1 kg)   LMP  (LMP Unknown)   SpO2 98%   BMI 35.77 kg/m  , BMI Body mass index is 35.77  kg/m. GEN: Well nourished, well developed, in no acute distress. HEENT: normal. Neck: Supple, no JVD, carotid bruits, or masses. Cardiac: RRR, no murmurs, rubs, or gallops. No clubbing, cyanosis, edema.  Radials/DP/PT 2+ and equal bilaterally.  Respiratory:  Respirations regular and unlabored, clear to auscultation bilaterally. GI: Soft, nontender, nondistended, BS + x 4. MS: no deformity or atrophy. Skin: warm and dry, no rash. Neuro:  Strength and sensation are intact. Psych: Normal affect.  Accessory Clinical Findings    ECG personally reviewed by me today-normal sinus rhythm possible left atrial enlargement incomplete right bundle branch block T wave abnormality consider anterior ischemia 77 bpm- No acute changes  EKG 12/31/2018 sinus bradycardia with an incomplete right bundle branch block inferior infarct undetermined age 46 bpm- No acute changes  EKG 11/28/2018 Sinus bradycardia 58 bpm  Cardiac catheterization 12/17/2018 1. Severe single-vessel coronary artery disease with 100% thrombotic occlusion of the mid RCA. Mild proximal and mid LAD disease is also noted. Transient vasospasm occurred in the RCA as well, which resolved with intracoronary nitroglycerin. 2. Low normal left ventricular systolic function with basal and mid inferior hypokinesis. LVEF 50-55%. 3. Normal left ventricular filling pressure. 4. Successful PCI to proximal and mid RCA with overlapping Synergy 2.75 x 20 mm (proximal) and Synergy 2.75 x 28 mm (distal) drug-eluting stents with 0% residual stenosis and TIMI-3 flow. A second stent was needed due to plaque shift versus focal dissection at the proximal edge of the more distal stent.  Recommendations: 1. Admit to 2-Heart ICU for post STEMI care. 2. Dual antiplatelet therapy with aspirin and ticagrelor for at least 12 months. 3. Aggressive secondary  prevention, including high intensity statin therapy.  Echocardiogram 12/18/2018 1. Left ventricular ejection fraction, by visual estimation, is 60 to 65%. The left ventricle has normal function. Normal left ventricular size. There is no left ventricular hypertrophy. 2. Global right ventricle has normal systolic function.The right ventricular size is normal. No increase in right ventricular wall thickness. 3. The mitral valve is normal in structure. Mild mitral valve regurgitation.  Assessment & Plan   1.  Coronary artery disease -no chest pain today. Cardiac catheterization with PCI to RCA with DES x2. Continue aspirin 81 mg tablet daily Continue losartan 25 mg tablet daily Continue metoprolol tartrate 12.5 mg tablet twice daily Continue nitroglycerin 0.4 mg sublingual tablet as needed Continue ticagrelor 90 mg tablet twice daily Heart healthy low-sodium diet-salty 6 given Increase physical activity as tolerated   Dyslipidemia-12/18/2018: Cholesterol 211; HDL 42; LDL Cholesterol 147; Triglycerides 109; VLDL 22 Continue atorvastatin 80 mg tablet daily Order fasting lipids and LFTs  Moderate obesity-BMI 34.44-weight today 225 pounds up from 200 pounds on 10/08/2018. Continue weight loss  Prediabetes-A1c 6.0 12/17/2018 2 Heart healthy low-sodium carb modified Increase physical activity as tolerated Continue weight loss  Insomnia-sleeping better at this time. Given instructions for sleep hygiene practices. Educated about limiting caffeine use, avoiding exercise 2 to 3 hours before bed, limiting screen time prior to bed, and waking up and going to bed at the same time each night.  Disposition: Follow-up with Dr. Gwenlyn Found in 6 months.  Jossie Ng. Charizma Gardiner NP-C    09/14/2019, 10:38 AM [place] [address] Office [phone] Fax [phone]

## 2019-09-14 ENCOUNTER — Ambulatory Visit (INDEPENDENT_AMBULATORY_CARE_PROVIDER_SITE_OTHER): Payer: Medicare Other | Admitting: General Practice

## 2019-09-14 ENCOUNTER — Encounter: Payer: Self-pay | Admitting: General Practice

## 2019-09-14 ENCOUNTER — Other Ambulatory Visit: Payer: Self-pay

## 2019-09-14 VITALS — BP 132/70 | HR 77 | Ht 66.5 in | Wt 225.0 lb

## 2019-09-14 DIAGNOSIS — E668 Other obesity: Secondary | ICD-10-CM

## 2019-09-14 DIAGNOSIS — E785 Hyperlipidemia, unspecified: Secondary | ICD-10-CM | POA: Diagnosis not present

## 2019-09-14 DIAGNOSIS — I2510 Atherosclerotic heart disease of native coronary artery without angina pectoris: Secondary | ICD-10-CM

## 2019-09-14 NOTE — Patient Instructions (Addendum)
Medication Instructions:    Your physician recommends that you continue on your current medications as directed. Please refer to the Current Medication list given to you today.  *If you need a refill on your cardiac medications before your next appointment, please call your pharmacy*   Lab Work:  RETURN FOR FASTING LIVER AND LIPID IN 2 WEEKS   If you have labs (blood work) drawn today and your tests are completely normal, you will receive your results only by: Marland Kitchen MyChart Message (if you have MyChart) OR . A paper copy in the mail If you have any lab test that is abnormal or we need to change your treatment, we will call you to review the results.   Testing/Procedures: NONE ORDERED  TODAY   Follow-Up: At [facility], you and your health needs are our priority.  As part of our continuing mission to provide you with exceptional heart care, we have created designated Provider Care Teams.  These Care Teams include your primary Cardiologist (physician) and Advanced Practice Providers (APPs -  Physician Assistants and Nurse Practitioners) who all work together to provide you with the care you need, when you need it.  We recommend signing up for the patient portal called "MyChart".  Sign up information is provided on this After Visit Summary.  MyChart is used to connect with patients for Virtual Visits (Telemedicine).  Patients are able to view lab/test results, encounter notes, upcoming appointments, etc.  Non-urgent messages can be sent to your provider as well.   To learn more about what you can do with MyChart, go to [email or web address].    Your next appointment:   6 month(s)  The format for your next appointment:   In Person  Provider:   You may see Quay Burow, MD or one of the following Advanced Practice Providers on your designated Care Team:    Kerin Ransom, PA-C  [place], [place]  Coletta Memos, [place]    Other Instructions  Increase your activity based upon  your limits

## 2019-10-18 ENCOUNTER — Other Ambulatory Visit: Payer: Self-pay

## 2019-10-18 MED ORDER — PANTOPRAZOLE SODIUM 40 MG PO TBEC: 40.0000 mg | DELAYED_RELEASE_TABLET | Freq: Every day | ORAL | 3 refills | Status: DC

## 2019-10-18 MED ORDER — LOSARTAN POTASSIUM 25 MG PO TABS: 25.0000 mg | ORAL_TABLET | Freq: Every day | ORAL | 3 refills | Status: DC

## 2019-10-18 MED ORDER — ATORVASTATIN CALCIUM 80 MG PO TABS: 80.0000 mg | ORAL_TABLET | Freq: Every day | ORAL | 3 refills | Status: DC

## 2019-11-04 ENCOUNTER — Encounter: Payer: Self-pay | Admitting: Physician Assistant

## 2019-11-04 ENCOUNTER — Ambulatory Visit (INDEPENDENT_AMBULATORY_CARE_PROVIDER_SITE_OTHER): Payer: Medicare Other | Admitting: Physician Assistant

## 2019-11-04 ENCOUNTER — Other Ambulatory Visit: Payer: Self-pay

## 2019-11-04 DIAGNOSIS — D229 Melanocytic nevi, unspecified: Secondary | ICD-10-CM | POA: Diagnosis not present

## 2019-11-04 DIAGNOSIS — D1800 Hemangioma unspecified site: Secondary | ICD-10-CM | POA: Diagnosis not present

## 2019-11-04 DIAGNOSIS — L821 Other seborrheic keratosis: Secondary | ICD-10-CM

## 2019-11-04 DIAGNOSIS — D2262 Melanocytic nevi of left upper limb, including shoulder: Secondary | ICD-10-CM | POA: Diagnosis not present

## 2019-11-04 DIAGNOSIS — Z1283 Encounter for screening for malignant neoplasm of skin: Secondary | ICD-10-CM

## 2019-11-04 DIAGNOSIS — D485 Neoplasm of uncertain behavior of skin: Secondary | ICD-10-CM | POA: Diagnosis not present

## 2019-11-04 DIAGNOSIS — T148XXA Other injury of unspecified body region, initial encounter: Secondary | ICD-10-CM

## 2019-11-04 DIAGNOSIS — L578 Other skin changes due to chronic exposure to nonionizing radiation: Secondary | ICD-10-CM

## 2019-11-04 DIAGNOSIS — L820 Inflamed seborrheic keratosis: Secondary | ICD-10-CM

## 2019-11-04 DIAGNOSIS — L814 Other melanin hyperpigmentation: Secondary | ICD-10-CM

## 2019-11-04 NOTE — Progress Notes (Signed)
KNEE REPLACEMENT 02/2018 + COVID 2020 RIGHT LOWER LEG BRUISE X4 DAYS

## 2019-11-04 NOTE — Patient Instructions (Signed)
Biopsy, Surgery (Curettage) & Surgery (Excision) Aftercare Instructions  1. Okay to remove bandage in 24 hours  2. Wash area with soap and water  3. Apply Vaseline to area twice daily until healed (Not Neosporin)  4. Okay to cover with a Band-Aid to decrease the chance of infection or prevent irritation from clothing; also it's okay to uncover lesion at home.  5. Suture instructions: return to our office in 7-10 or 10-14 days for a nurse visit for suture removal. Variable healing with sutures, if pain or itching occurs call our office. It's okay to shower or bathe 24 hours after sutures are given.  6. The following risks may occur after a biopsy, curettage or excision: bleeding, scarring, discoloration, recurrence, infection (redness, yellow drainage, pain or swelling).  7. If your results are positive we will contact you, if you do not hear from [place] review your MyChart.  Stay Well

## 2019-11-04 NOTE — Progress Notes (Signed)
   New Patient   Subjective  Jasmine Stephens is a 69 y.o. female who presents for the following: Annual Exam (RIGHT EAR RIM X MONTHS?).   The following portions of the chart were reviewed this encounter and updated as appropriate: Tobacco  Allergies  Meds  Problems  Med Hx  Surg Hx  Fam Hx      Objective  Well appearing patient in no apparent distress; mood and affect are within normal limits.  A full examination was performed including scalp, head, eyes, ears, nose, lips, neck, chest, axillae, abdomen, back, buttocks, bilateral upper extremities, bilateral lower extremities, hands, feet, fingers, toes, fingernails, and toenails. All findings within normal limits unless otherwise noted below.  Objective  waist up: No atypical nevi No signs of non-mole skin cancer.   Objective  Left Wrist - Posterior: Black macule     Objective  Right Lower Leg - Anterior: Large  purple nodule. Tender to the touch. No palpable cords. Negative Homan's  Images    Objective  Left Forearm - Posterior: Erythematous stuck-on, waxy papule or plaque.   Assessment & Plan  Skin exam for malignant neoplasm waist up  Yearly skin exams  Neoplasm of uncertain behavior of skin Left Wrist - Posterior  Skin / nail biopsy Type of biopsy: punch   Informed consent: discussed and consent obtained   Procedure prep:  Patient was prepped and draped in usual sterile fashion (nonsterile) Prep type:  Chlorhexidine Anesthesia: the lesion was anesthetized in a standard fashion   Anesthetic:  1% lidocaine w/ epinephrine 1-100,000 local infiltration Punch size:  6 mm Suture size:  4-0 Suture type: Prolene (polypropylene)   Suture removal (days):  10 Hemostasis achieved with: suture   Outcome: patient tolerated procedure well   Post-procedure details: wound care instructions given   Post-procedure details comment:  Nonsterile  Specimen 1 - Surgical pathology Differential Diagnosis: blue nevus  atypia Check Margins: No  Hematoma Right Lower Leg - Anterior  Intermittent hot and cold application. Return to PCP if swelling or pain.  Seborrheic keratosis, inflamed Left Forearm - Posterior  Destruction of lesion - Left Forearm - Posterior Complexity: simple   Destruction method: cryotherapy   Informed consent: discussed and consent obtained   Timeout:  patient name, date of birth, surgical site, and procedure verified Lesion destroyed using liquid nitrogen: Yes   Cryotherapy cycles:  1 Outcome: patient tolerated procedure well with no complications   Post-procedure details: wound care instructions given    Lentigines - Scattered tan macules - Discussed due to sun exposure - Benign, observe - Call for any changes  Seborrheic Keratoses - Stuck-on, waxy, tan-brown papules and plaques  - Discussed benign etiology and prognosis. - Observe - Call for any changes  Melanocytic Nevi - Tan-brown and/or pink-flesh-colored symmetric macules and papules - Benign appearing on exam today - Observation - Call clinic for new or changing moles - Recommend daily use of broad spectrum spf 30+ sunscreen to sun-exposed areas.   Hemangiomas - Red papules - Discussed benign nature - Observe - Call for any changes  Actinic Damage - diffuse scaly erythematous macules with underlying dyspigmentation - Recommend daily broad spectrum sunscreen SPF 30+ to sun-exposed areas, reapply every 2 hours as needed.  - Call for new or changing lesions.  Skin cancer screening performed today.

## 2019-12-07 ENCOUNTER — Other Ambulatory Visit: Payer: Self-pay | Admitting: Cardiology

## 2019-12-20 ENCOUNTER — Other Ambulatory Visit: Payer: Self-pay | Admitting: Cardiology

## 2019-12-22 ENCOUNTER — Other Ambulatory Visit: Payer: Self-pay | Admitting: Nurse Practitioner

## 2019-12-22 DIAGNOSIS — Z1231 Encounter for screening mammogram for malignant neoplasm of breast: Secondary | ICD-10-CM

## 2020-02-01 ENCOUNTER — Inpatient Hospital Stay: Admission: RE | Admit: 2020-02-01 | Payer: Medicare Other | Source: Ambulatory Visit

## 2020-02-15 DIAGNOSIS — R0789 Other chest pain: Secondary | ICD-10-CM | POA: Diagnosis not present

## 2020-03-07 NOTE — Progress Notes (Signed)
Cardiology Clinic Note   Patient Name: Jasmine Stephens Date of Encounter: 03/10/2020  Primary Care Provider:  Chevis Pretty, [place] Primary Cardiologist:  Quay Burow, MD  Patient Profile    Jasmine Stephens 69 year old female presents today for a evaluation of her coronary artery disease, and dyslipidemia.  Past Medical History    Past Medical History:  Diagnosis Date   Allergy    Anxiety    Anxiety and depression    Arthritis    knee   Colon polyp    COVID-19 02/2019   Depression    Diverticulosis    GERD (gastroesophageal reflux disease)    Irritable bowel syndrome    STEMI (ST elevation myocardial infarction) ([place]) 12/17/2018   Past Surgical History:  Procedure Laterality Date   BACK SURGERY     CHOLECYSTECTOMY     COLONOSCOPY     COLONOSCOPY N/A 05/31/2013   Procedure: COLONOSCOPY;  Surgeon: Lafayette Dragon, MD;  Location: [facility];  Service: Endoscopy;  Laterality: N/A;   CORONARY/GRAFT ACUTE MI REVASCULARIZATION N/A 12/17/2018   Procedure: Coronary/Graft Acute MI Revascularization;  Surgeon: Nelva Bush, MD;  Location: [place] [facility];  Service: Cardiovascular;  Laterality: N/A;   ESOPHAGOGASTRODUODENOSCOPY N/A 05/31/2013   Procedure: ESOPHAGOGASTRODUODENOSCOPY (EGD);  Surgeon: Lafayette Dragon, MD;  Location: [facility];  Service: Endoscopy;  Laterality: N/A;   KNEE ARTHROSCOPY Right 10/2016   LEFT HEART CATH AND CORONARY ANGIOGRAPHY N/A 12/17/2018   Procedure: LEFT HEART CATH AND CORONARY ANGIOGRAPHY;  Surgeon: Nelva Bush, MD;  Location: [place] [facility];  Service: Cardiovascular;  Laterality: N/A;   MALONEY DILATION  05/31/2013   Procedure: Venia Minks DILATION;  Surgeon: Lafayette Dragon, MD;  Location: [facility];  Service: Endoscopy;;   PARTIAL HYSTERECTOMY     REPLACEMENT TOTAL KNEE Right 02/2018   UPPER GASTROINTESTINAL ENDOSCOPY      Allergies  Allergies  Allergen Reactions   Compazine [Prochlorperazine] Anaphylaxis    Phenothiazines Anaphylaxis   Chlorpromazine Hcl Other (See Comments)    Reaction unknown   Penicillins Other (See Comments)    Reaction unkwown Did it involve swelling of the face/tongue/throat, SOB, or low BP? Unknown Did it involve sudden or severe rash/hives, skin peeling, or any reaction on the inside of your mouth or nose? Unknown Did you need to seek medical attention at a hospital or doctor's office? Unknown When did it last happen? unk If all above answers are NO, may proceed with cephalosporin use.    Latex Rash    With continued exposure to latex   Tape Rash   Tapentadol Rash    History of Present Illness    Jasmine Stephens was discharged from [facility] on 12/18/2018. She presented to the emergency department on 12/17/2018 after developing sudden chest pain that she described as a 9 out of 10 pain. She underwent cardiac catheterization and was Stephens to have a occluded mid RCA lesion that received a DES x1, she also had a 30% lesion of her LAD and no other significant coronary disease. Her follow-up echocardiogram on 12/18/2018 showed a estimated LVEF of 60 to 65%, and mild mitral valve regurgitation.  Her PMH also includes GERD, diverticulosis, IBS, dyslipidemia, anxiety, moderate obesity, and prediabetes.  She presented to the clinic 12/31/2018 and stated she felt well. She had slowly progressed into her normal daily activities. 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 She stated it was very stressful at times. She also stated that she had been  having some nasal congestion and had been using Afrin nasal spray. When asked if her blood pressure had been running at home she states that they have been averaging 120s over 60s. She stated she used her nasal spray about 30 minutes to 1hour prior to her appointment this morning. She also stated that she was in the process of retiring and did not have a consistent work schedule like  she used to and was having trouble sleeping.  She saw Jasmine Stephens on 03/09/2019.  She was doing well at that time, was tolerating her medications, and had no cardiac complaints.  She presented to the clinic 09/14/2019 and stated she felt well .  She had intermittent episodes of right-sided chest pain which was muscular in nature.  This was related to roughhousing with her grandkids and was nonexertional in nature.  She also noted some right-sided arm and chest pain after her second Covid vaccination.  She had been somewhat sedentary due to pain in her right knee over the last 3 weeks.  She also stated that she ran out of her metoprolol tartrate and had not taken it that day. She  had the medication refilled and stated that her blood pressure was well controlled at home with systolics in the 027X.  She had changed her diet and was using more herbs and spices as opposed to salting her food.  She indicated that she was going to start increasing her physical activity as well.  I  gave her the salty 6 information sheet, encourage her to increase her physical activity, and planned her follow-up with Jasmine Stephens in 6 months.  Also ordered fasting lipid panel and LFTs.  Her lab work was not completed.  She presents to the clinic today for follow-up evaluation states she feels well today.  She continues to substitute teach occasionally and continues to take care of her grandchildren.  She reports that she is now taking care of them 3 days/week.  Her blood pressure today is slightly elevated at 140/76.  When asked about her blood pressure readings at home she reports that they are in the 100s-110s over 70s.  She states that since she had her second child she has had trouble with reflux and a hiatal hernia.  Her pantoprazole helps to some extent.  However, she states she enjoys tomato-based foods.  She is working on increasing her physical activity.  She was unable to have her fasting lipid liver panel drawn due to her busy  lifestyle.  I have asked her to come back to the office fasting so that we may drill her lipid panel and LFTs.  I will give her the salty 6 diet sheet, give her the GERD diet sheet, and have her return for follow-up in 6 months.  Today shedenies chest pain, shortness of breath, lower extremity edema, fatigue, palpitations, melena, hematuria, hemoptysis, diaphoresis, weakness, presyncope, syncope, orthopnea, and PND.  Home Medications    Prior to Admission medications   Medication Sig Start Date End Date Taking? Authorizing Provider  acetaminophen (TYLENOL) 500 MG tablet Take 500 mg by mouth every 6 (six) hours as needed for mild pain or headache.     [provider]  aspirin 81 MG chewable tablet Chew 1 tablet (81 mg total) by mouth daily. 12/20/18   Erlene Quan, PA-C  atorvastatin (LIPITOR) 80 MG tablet Take 1 tablet (80 mg total) by mouth daily at 6 PM. 10/18/19   Erlene Quan, PA-C  BRILINTA 90 MG  TABS tablet TAKE ONE TABLET BY MOUTH TWICE DAILY 12/21/19   Lorretta Harp, MD  HYDROcodone-homatropine [facility]) 5-1.5 MG/5ML syrup Take 5 mLs by mouth every 6 (six) hours as needed for cough. 04/02/19   Loman Brooklyn, FNP  losartan (COZAAR) 25 MG tablet Take 1 tablet (25 mg total) by mouth daily. 10/18/19   Erlene Quan, PA-C  meloxicam (MOBIC) 15 MG tablet Take 15 mg by mouth daily as needed (knee pain).  09/08/18   [provider]  metoprolol tartrate (LOPRESSOR) 25 MG tablet TAKE 1/2 TABLET TWICE DAILY 12/07/19   Erlene Quan, PA-C  Multiple Vitamin (MULTIVITAMIN WITH MINERALS) TABS tablet Take 1 tablet by mouth daily.    [provider]  nitroGLYCERIN (NITROSTAT) 0.4 MG SL tablet Place 1 tablet (0.4 mg total) under the tongue every 5 (five) minutes x 3 doses as needed for chest pain. 12/01/2018   Erlene Quan, PA-C  Omega-3 Fatty Acids (FISH OIL PO) Take 2 capsules by mouth daily.    [provider]  pantoprazole (PROTONIX) 40 MG tablet Take 1 tablet  (40 mg total) by mouth daily. 10/18/19   Erlene Quan, PA-C    Family History    Family History  Problem Relation Age of Onset   Heart failure Mother 58   Heart disease Father 56   Colon cancer Paternal Grandmother    Heart disease Brother    Colon cancer Paternal Aunt    She indicated that her mother is deceased. She indicated that her father is deceased. She indicated that the status of her brother is unknown. She indicated that her maternal grandmother is deceased. She indicated that her maternal grandfather is deceased. She indicated that her paternal grandmother is deceased. She indicated that her paternal grandfather is deceased. She indicated that the status of her paternal aunt is unknown.  Social History    Social History   Socioeconomic History   Marital status: Married    Spouse name: Not on file   Number of children: 3   Years of education: Not on file   Highest education level: Not on file  Occupational History   Occupation: Retired  Tobacco Use   Smoking status: Never Smoker   Smokeless tobacco: Never Used  Scientific laboratory technician Use: Never used  Substance and Sexual Activity   Alcohol use: Yes    Comment: occasional wine   Drug use: No   Sexual activity: Not on file  Other Topics Concern   Not on file  Social History Narrative   Lives in [place].    Social Determinants of Health   Financial Resource Strain: Not on file  Food Insecurity: Not on file  Transportation Needs: Not on file  Physical Activity: Not on file  Stress: Not on file  Social Connections: Not on file  Intimate Partner Violence: Not on file     Review of Systems    General:  No chills, fever, night sweats or weight changes.  Cardiovascular:  No chest pain, dyspnea on exertion, edema, orthopnea, palpitations, paroxysmal nocturnal dyspnea. Dermatological: No rash, lesions/masses Respiratory: No cough, dyspnea Urologic: No hematuria, dysuria Abdominal:   No nausea,  vomiting, diarrhea, bright red blood per rectum, melena, or hematemesis Neurologic:  No visual changes, wkns, changes in mental status. All other systems reviewed and are otherwise negative except as noted above.  Physical Exam    VS:  BP 140/76    Pulse (!) 57    Ht  5' 6.75" (1.695 m)    Wt 223 lb 6.4 oz (101.3 kg)    LMP  (LMP Unknown)    SpO2 96%    BMI 35.25 kg/m  , BMI Body mass index is 35.25 kg/m. GEN: Well nourished, well developed, in no acute distress. HEENT: normal. Neck: Supple, no JVD, carotid bruits, or masses. Cardiac: RRR, no murmurs, rubs, or gallops. No clubbing, cyanosis, edema.  Radials/DP/PT 2+ and equal bilaterally.  Respiratory:  Respirations regular and unlabored, clear to auscultation bilaterally. GI: Soft, nontender, nondistended, BS + x 4. MS: no deformity or atrophy. Skin: warm and dry, no rash. Neuro:  Strength and sensation are intact. Psych: Normal affect.  Accessory Clinical Findings    Recent Labs: No results Stephens for requested labs within last 8760 hours.   Recent Lipid Panel    Component Value Date/Time   CHOL 211 (H) 12/18/2018 0448   CHOL 251 (H) 10/24/2017 1153   TRIG 109 12/18/2018 0448   HDL 42 12/18/2018 0448   HDL 59 10/24/2017 1153   CHOLHDL 5.0 12/18/2018 0448   VLDL 22 12/18/2018 0448   LDLCALC 147 (H) 12/18/2018 0448   LDLCALC 177 (H) 10/24/2017 1153    ECG personally reviewed by me today none today.  EKG 09/14/2019 normal sinus rhythm possible left atrial enlargement incomplete right bundle branch block T wave abnormality consider anterior ischemia 77 bpm- No acute changes  EKG 12/31/2018 sinus bradycardia with an incomplete right bundle branch block inferior infarct undetermined age 35 bpm- No acute changes  EKG 12/03/2018 Sinus bradycardia 58 bpm  Cardiac catheterization 12/17/2018 1. Severe single-vessel coronary artery disease with 100% thrombotic occlusion of the mid RCA. Mild proximal and mid LAD disease is also  noted. Transient vasospasm occurred in the RCA as well, which resolved with intracoronary nitroglycerin. 2. Low normal left ventricular systolic function with basal and mid inferior hypokinesis. LVEF 50-55%. 3. Normal left ventricular filling pressure. 4. Successful PCI to proximal and mid RCA with overlapping Synergy 2.75 x 20 mm (proximal) and Synergy 2.75 x 28 mm (distal) drug-eluting stents with 0% residual stenosis and TIMI-3 flow. A second stent was needed due to plaque shift versus focal dissection at the proximal edge of the more distal stent.  Recommendations: 1. Admit to 2-Heart ICU for post STEMI care. 2. Dual antiplatelet therapy with aspirin and ticagrelor for at least 12 months. 3. Aggressive secondary prevention, including high intensity statin therapy.  Diagnostic Dominance: Right    Intervention      Echocardiogram 12/18/2018 1. Left ventricular ejection fraction, by visual estimation, is 60 to 65%. The left ventricle has normal function. Normal left ventricular size. There is no left ventricular hypertrophy. 2. Global right ventricle has normal systolic function.The right ventricular size is normal. No increase in right ventricular wall thickness. 3. The mitral valve is normal in structure. Mild mitral valve regurgitation.  Assessment & Plan   1.  Coronary artery disease -denies chest pain.  Underwentcardiac catheterization with PCI to RCA with DES x2 on 12/17/2018 Continue aspirin, losartan, metoprolol, nitroglycerin, Brilinta  Heart healthy low-sodium diet-salty 6 given Increase physical activity as tolerated  Dyslipidemia-12/18/2018: Cholesterol 211; HDL 42; LDL Cholesterol 147; Triglycerides 109; VLDL 22 Continue atorvastatin  Order fasting lipids and LFTs  Moderate obesity-weight today223.4 pounds up from 200 pounds on 10/08/2018. Continue weight loss  Prediabetes-A1c 6.0 12/17/2018 Heart healthy low-sodium carb modified Increase physical  activity as tolerated Continue weight loss Follows with PCP  GERD-intermittent episodes of reflux.  Reports  some improvement with pantoprazole.  Does not follow strict GERD diet.  Noted to have hiatal hernia after second delivery. GERD diet-information sheet given Increase physical activity as tolerated  Disposition: Follow-up with JasmineBerryin 6 months.   Jossie Ng. Tram Wrenn NP-C    03/10/2020, 4:09 PM [facility] [place] [address] Office [phone] Fax [phone]  Notice: This dictation was prepared with Dragon dictation along with smaller phrase technology. Any transcriptional errors that result from this process are unintentional and may not be corrected upon review.

## 2020-03-10 ENCOUNTER — Encounter: Payer: Self-pay | Admitting: General Practice

## 2020-03-10 ENCOUNTER — Other Ambulatory Visit: Payer: Self-pay

## 2020-03-10 ENCOUNTER — Ambulatory Visit (INDEPENDENT_AMBULATORY_CARE_PROVIDER_SITE_OTHER): Payer: Medicare Other | Admitting: General Practice

## 2020-03-10 VITALS — BP 140/76 | HR 57 | Ht 66.75 in | Wt 223.4 lb

## 2020-03-10 DIAGNOSIS — E668 Other obesity: Secondary | ICD-10-CM | POA: Diagnosis not present

## 2020-03-10 DIAGNOSIS — Z79899 Other long term (current) drug therapy: Secondary | ICD-10-CM

## 2020-03-10 DIAGNOSIS — I2510 Atherosclerotic heart disease of native coronary artery without angina pectoris: Secondary | ICD-10-CM | POA: Diagnosis not present

## 2020-03-10 DIAGNOSIS — E785 Hyperlipidemia, unspecified: Secondary | ICD-10-CM

## 2020-03-10 DIAGNOSIS — R7303 Prediabetes: Secondary | ICD-10-CM

## 2020-03-10 NOTE — Patient Instructions (Signed)
Medication Instructions:  The current medical regimen is effective;  continue present plan and medications as directed. Please refer to the Current Medication list given to you today.  *If you need a refill on your cardiac medications before your next appointment, please call your pharmacy*  Lab Work: FASTING LIPID AND LFT If you have labs (blood work) drawn today and your tests are completely normal, you will receive your results only by:  [place] (if you have MyChart) OR A paper copy in the mail.  If you have any lab test that is abnormal or we need to change your treatment, we will call you to review the results. You may go to any [facility] that is convenient for you however, we do have a lab in our office that is able to assist you. You DO NOT need an appointment for our lab. The lab is open 8:00am and closes at 4:00pm. Lunch 12:45 - 1:45pm.  Testing/Procedures: NONE  Special Instructions READ AND FOLLOW GERD DIET   PLEASE READ AND FOLLOW SALTY 6-ATTACHED-1,800 mg daily  Follow-Up: Your next appointment:  6 month(s) In Person with Quay Burow, MD OR IF UNAVAILABLE [place], FNP-C   At [facility], you and your health needs are our priority.  As part of our continuing mission to provide you with exceptional heart care, we have created designated Provider Care Teams.  These Care Teams include your primary Cardiologist (physician) and Advanced Practice Providers (APPs -  Physician Assistants and Nurse Practitioners) who all work together to provide you with the care you need, when you need it.  We recommend signing up for the patient portal called "MyChart".  Sign up information is provided on this After Visit Summary.  MyChart is used to connect with patients for Virtual Visits (Telemedicine).  Patients are able to view lab/test results, encounter notes, upcoming appointments, etc.  Non-urgent messages can be sent to your provider as well.   To learn more about what you can  do with MyChart, go to [email or web address].         Food Choices for Gastroesophageal Reflux Disease, Adult When you have gastroesophageal reflux disease (GERD), the foods you eat and your eating habits are very important. Choosing the right foods can help ease your discomfort. Think about working with a nutrition specialist (dietitian) to help you make good choices. What are tips for following this plan?  Meals  Choose healthy foods that are low in fat, such as fruits, vegetables, whole grains, low-fat dairy products, and lean meat, fish, and poultry.  Eat small meals often instead of 3 large meals a day. Eat your meals slowly, and in a place where you are relaxed. Avoid bending over or lying down until 2-3 hours after eating.  Avoid eating meals 2-3 hours before bed.  Avoid drinking a lot of liquid with meals.  Cook foods using methods other than frying. Bake, grill, or broil food instead.  Avoid or limit: ? Chocolate. ? Peppermint or spearmint. ? Alcohol. ? Pepper. ? Black and decaffeinated coffee. ? Black and decaffeinated tea. ? Bubbly (carbonated) soft drinks. ? Caffeinated energy drinks and soft drinks.  Limit high-fat foods such as: ? Fatty meat or fried foods. ? Whole milk, cream, butter, or ice cream. ? Nuts and nut butters. ? Pastries, donuts, and sweets made with butter or shortening.  Avoid foods that cause symptoms. These foods may be different for everyone. Common foods that cause symptoms include: ? Tomatoes. ? Oranges, lemons, and limes. ?  Peppers. ? Spicy food. ? Onions and garlic. ? Vinegar. Lifestyle  Maintain a healthy weight. Ask your doctor what weight is healthy for you. If you need to lose weight, work with your doctor to do so safely.  Exercise for at least 30 minutes for 5 or more days each week, or as told by your doctor.  Wear loose-fitting clothes.  Do not smoke. If you need help quitting, ask your doctor.  Sleep with the head  of your bed higher than your feet. Use a wedge under the mattress or blocks under the bed frame to raise the head of the bed. Summary  When you have gastroesophageal reflux disease (GERD), food and lifestyle choices are very important in easing your symptoms.  Eat small meals often instead of 3 large meals a day. Eat your meals slowly, and in a place where you are relaxed.  Limit high-fat foods such as fatty meat or fried foods.  Avoid bending over or lying down until 2-3 hours after eating.  Avoid peppermint and spearmint, caffeine, alcohol, and chocolate. This information is not intended to replace advice given to you by your health care provider. Make sure you discuss any questions you have with your health care provider

## 2020-06-22 DIAGNOSIS — J019 Acute sinusitis, unspecified: Secondary | ICD-10-CM | POA: Diagnosis not present

## 2020-07-19 ENCOUNTER — Ambulatory Visit: Payer: Medicare Other

## 2020-08-25 ENCOUNTER — Telehealth: Payer: Self-pay

## 2020-08-25 MED ORDER — METOPROLOL TARTRATE 25 MG PO TABS: 12.5000 mg | ORAL_TABLET | Freq: Two times a day (BID) | ORAL | 2 refills | Status: DC

## 2020-08-25 NOTE — Telephone Encounter (Signed)
Medication refill request for Metoprolol tartrate  Approved and sent to pharmacy

## 2020-08-29 ENCOUNTER — Ambulatory Visit: Payer: Medicare Other | Admitting: Cardiovascular Disease

## 2020-08-29 ENCOUNTER — Other Ambulatory Visit: Payer: Self-pay

## 2020-08-29 ENCOUNTER — Encounter: Payer: Self-pay | Admitting: Cardiovascular Disease

## 2020-08-29 DIAGNOSIS — E785 Hyperlipidemia, unspecified: Secondary | ICD-10-CM

## 2020-08-29 DIAGNOSIS — I2111 ST elevation (STEMI) myocardial infarction involving right coronary artery: Secondary | ICD-10-CM | POA: Diagnosis not present

## 2020-08-29 LAB — LIPID PANEL
Chol/HDL Ratio: 2.6 ratio (ref 0.0–4.4)
Cholesterol, Total: 158 mg/dL (ref 100–199)
HDL: 61 mg/dL (ref >39)
LDL Chol Calc (NIH): 79 mg/dL (ref 0–99)
Triglycerides: 102 mg/dL (ref 0–149)
VLDL Cholesterol Cal: 18 mg/dL (ref 5–40)

## 2020-08-29 LAB — HEPATIC FUNCTION PANEL
ALT: 47 IU/L — ABNORMAL HIGH (ref 0–32)
AST: 49 IU/L — ABNORMAL HIGH (ref 0–40)
Albumin: 4.6 g/dL (ref 3.8–4.8)
Alkaline Phosphatase: 109 IU/L (ref 44–121)
Bilirubin Total: 0.4 mg/dL (ref 0.0–1.2)
Bilirubin, Direct: 0.12 mg/dL (ref 0.00–0.40)
Total Protein: 7.4 g/dL (ref 6.0–8.5)

## 2020-08-29 MED ORDER — CLOPIDOGREL BISULFATE 75 MG PO TABS: 75.0000 mg | ORAL_TABLET | Freq: Every day | ORAL | 4 refills | Status: DC

## 2020-08-29 NOTE — Progress Notes (Signed)
08/29/2020 Jasmine Stephens   1950-08-23  [id number]  Primary Physician Hassell Done Mary-Margaret, FNP Primary Cardiologist: Lorretta Harp MD Garret Reddish, [place], [place]  HPI:  Jasmine Stephens is a 70 y.o.  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  Her mother was Mollie Germany, a patient of mine who died in 06-16-06 .  I am being seen at her request because of my relationship with her mother for her first MD post hospital visit.  Her primary provider is Ronnald Collum, NP at [facility].  I last saw her in the office 03/09/2019.  Her risk factors include treated hypertension hyperlipidemia.  She has a strong family history with a father who had myocardial infarction and died.  Mother had CAD and a brother had an MI in his 69s.  She had inferior STEMI 12/17/2018 treated with PCI and overlapping drug-eluting stents in her mid RCA by Dr. Saunders Revel with otherwise no significant remaining CAD and normal global LV function with an inferior wall motion abnormality.  She was discharged home 2 days later on low-dose aspirin, Brilinta, high-dose statin therapy, losartan and beta-blocker.    Since I saw her a year and a half ago she has seen Coletta Memos, FNP several times and has remained stable.  She has had some reflux type symptoms but otherwise denies chest pain or shortness of breath.  She has lost approximate 15 pounds.  She has changed her eating habits.  She walks 1-2 times a day without symptoms or limitation.  She remains on dual antiplatelet therapy.  Current Meds  Medication Sig  . acetaminophen (TYLENOL) 500 MG tablet Take 500 mg by mouth every 6 (six) hours as needed for mild pain or headache.   Marland Kitchen aspirin 81 MG chewable tablet Chew 1 tablet (81 mg total) by mouth daily.  Marland Kitchen atorvastatin (LIPITOR) 80 MG tablet Take 1 tablet (80 mg total) by mouth daily at 6 PM.  . BRILINTA  90 MG TABS tablet TAKE ONE TABLET BY MOUTH TWICE DAILY  . HYDROcodone-homatropine (HYCODAN) 5-1.5 MG/5ML syrup Take 5 mLs by mouth every 6 (six) hours as needed for cough.  . losartan (COZAAR) 25 MG tablet Take 1 tablet (25 mg total) by mouth daily.  . meloxicam (MOBIC) 15 MG tablet Take 15 mg by mouth daily as needed (knee pain).   . metoprolol tartrate (LOPRESSOR) 25 MG tablet Take 0.5 tablets (12.5 mg total) by mouth 2 (two) times daily.  . Multiple Vitamin (MULTIVITAMIN WITH MINERALS) TABS tablet Take 1 tablet by mouth daily.  . nitroGLYCERIN (NITROSTAT) 0.4 MG SL tablet Place 1 tablet (0.4 mg total) under the tongue every 5 (five) minutes x 3 doses as needed for chest pain.  . Omega-3 Fatty Acids (FISH OIL PO) Take 2 capsules by mouth daily.  . pantoprazole (PROTONIX) 40 MG tablet Take 1 tablet (40 mg total) by mouth daily.     Allergies  Allergen Reactions  . Compazine [Prochlorperazine] Anaphylaxis  . Phenothiazines Anaphylaxis  . Chlorpromazine Hcl Other (See Comments)    Reaction unknown  . Penicillins Other (See Comments)    Reaction unkwown Did it involve swelling of the face/tongue/throat, SOB, or low BP? Unknown Did it involve sudden or severe rash/hives, skin peeling, or any reaction on the inside of your mouth or nose? Unknown Did you need to seek medical attention at a hospital or doctor's office?  Unknown When did it last happen? unk If all above answers are "NO", may proceed with cephalosporin use.   . Latex Rash    With continued exposure to latex  . Tape Rash  . Tapentadol Rash    Social History   Socioeconomic History  . Marital status: Married    Spouse name: Not on file  . Number of children: 3  . Years of education: Not on file  . Highest education level: Not on file  Occupational History  . Occupation: Retired  Tobacco Use  . Smoking status: Never Smoker  . Smokeless tobacco: Never Used  Vaping Use  . Vaping Use: Never used  Substance and  Sexual Activity  . Alcohol use: Yes    Comment: occasional wine  . Drug use: No  . Sexual activity: Not on file  Other Topics Concern  . Not on file  Social History Narrative   Lives in [place].    Social Determinants of Health   Financial Resource Strain: Not on file  Food Insecurity: Not on file  Transportation Needs: Not on file  Physical Activity: Not on file  Stress: Not on file  Social Connections: Not on file  Intimate Partner Violence: Not on file     Review of Systems: General: negative for chills, fever, night sweats or weight changes.  Cardiovascular: negative for chest pain, dyspnea on exertion, edema, orthopnea, palpitations, paroxysmal nocturnal dyspnea or shortness of breath Dermatological: negative for rash Respiratory: negative for cough or wheezing Urologic: negative for hematuria Abdominal: negative for nausea, vomiting, diarrhea, bright red blood per rectum, melena, or hematemesis Neurologic: negative for visual changes, syncope, or dizziness All other systems reviewed and are otherwise negative except as noted above.    Blood pressure 128/72, pulse 66, height 5' 6.75" (1.695 m), weight 208 lb 6.4 oz (94.5 kg), SpO2 96 %.  General appearance: alert and no distress Neck: no adenopathy, no carotid bruit, no JVD, supple, symmetrical, trachea midline and thyroid not enlarged, symmetric, no tenderness/mass/nodules Lungs: clear to auscultation bilaterally Heart: regular rate and rhythm, S1, S2 normal, no murmur, click, rub or gallop Extremities: extremities normal, atraumatic, no cyanosis or edema Pulses: 2+ and symmetric Skin: Skin color, texture, turgor normal. No rashes or lesions Neurologic: Alert and oriented X 3, normal strength and tone. Normal symmetric reflexes. Normal coordination and gait  EKG sinus rhythm at 66 without ST or T wave changes.  I personally reviewed this EKG.  ASSESSMENT AND PLAN:   Dyslipidemia, goal LDL below 70 History of  dyslipidemia on high-dose atorvastatin with lipid profile performed 12/18/2018 revealing total cholesterol 211, LDL of 147 and HDL of 42.  We will recheck a lipid liver profile this morning.  She has said that she has changed her eating habits significantly.  STEMI involving right coronary artery [facility]) History of CAD status post inferior STEMI 12/17/2018 treated with PCI with overlapping drug-eluting stents by Dr. Saunders Revel of the mid RCA.  She had no other significant CAD and had normal global LV function with an inferior wall motion abnormality.  She remains on dual antiplatelet therapy including aspirin and Brilinta.  Going to transition her to Plavix.  She is completely asymptomatic.  She is very active and walks 1-2 times a day.      Lorretta Harp MD FACP,FACC,FAHA, [facility] 08/29/2020 9:21 AM

## 2020-08-29 NOTE — Patient Instructions (Signed)
Medication Instructions:   -Start clopidogrel (plavix) 75mg  once daily.  *If you need a refill on your cardiac medications before your next appointment, please call your pharmacy*   Lab Work: Your physician recommends that you have labs drawn today: lipid/liver profile.  If you have labs (blood work) drawn today and your tests are completely normal, you will receive your results only by: Marland Kitchen MyChart Message (if you have MyChart) OR . A paper copy in the mail If you have any lab test that is abnormal or we need to change your treatment, we will call you to review the results.   Follow-Up: At [facility], you and your health needs are our priority.  As part of our continuing mission to provide you with exceptional heart care, we have created designated Provider Care Teams.  These Care Teams include your primary Cardiologist (physician) and Advanced Practice Providers (APPs -  Physician Assistants and Nurse Practitioners) who all work together to provide you with the care you need, when you need it.  We recommend signing up for the patient portal called "MyChart".  Sign up information is provided on this After Visit Summary.  MyChart is used to connect with patients for Virtual Visits (Telemedicine).  Patients are able to view lab/test results, encounter notes, upcoming appointments, etc.  Non-urgent messages can be sent to your provider as well.   To learn more about what you can do with MyChart, go to [email or web address].    Your next appointment:   12 month(s)  The format for your next appointment:   In Person  Provider:   Quay Burow, MD

## 2020-08-29 NOTE — Assessment & Plan Note (Signed)
History of dyslipidemia on high-dose atorvastatin with lipid profile performed 12/18/2018 revealing total cholesterol 211, LDL of 147 and HDL of 42.  We will recheck a lipid liver profile this morning.  She has said that she has changed her eating habits significantly.

## 2020-08-29 NOTE — Assessment & Plan Note (Signed)
History of CAD status post inferior STEMI 12/17/2018 treated with PCI with overlapping drug-eluting stents by Dr. Saunders Revel of the mid RCA.  She had no other significant CAD and had normal global LV function with an inferior wall motion abnormality.  She remains on dual antiplatelet therapy including aspirin and Brilinta.  Going to transition her to Plavix.  She is completely asymptomatic.  She is very active and walks 1-2 times a day.

## 2020-11-25 ENCOUNTER — Other Ambulatory Visit: Payer: Self-pay | Admitting: Cardiovascular Disease

## 2020-11-27 DIAGNOSIS — I4510 Unspecified right bundle-branch block: Secondary | ICD-10-CM | POA: Diagnosis not present

## 2020-11-27 DIAGNOSIS — R071 Chest pain on breathing: Secondary | ICD-10-CM | POA: Diagnosis not present

## 2020-11-27 DIAGNOSIS — J984 Other disorders of lung: Secondary | ICD-10-CM | POA: Diagnosis not present

## 2020-11-27 DIAGNOSIS — R197 Diarrhea, unspecified: Secondary | ICD-10-CM | POA: Diagnosis not present

## 2020-11-27 DIAGNOSIS — M25512 Pain in left shoulder: Secondary | ICD-10-CM | POA: Diagnosis not present

## 2020-11-27 DIAGNOSIS — R0602 Shortness of breath: Secondary | ICD-10-CM | POA: Diagnosis not present

## 2020-11-27 DIAGNOSIS — M62838 Other muscle spasm: Secondary | ICD-10-CM | POA: Diagnosis not present

## 2020-11-27 DIAGNOSIS — R9431 Abnormal electrocardiogram [ECG] [EKG]: Secondary | ICD-10-CM | POA: Diagnosis not present

## 2020-11-27 DIAGNOSIS — R001 Bradycardia, unspecified: Secondary | ICD-10-CM | POA: Diagnosis not present

## 2020-11-27 DIAGNOSIS — H9202 Otalgia, left ear: Secondary | ICD-10-CM | POA: Diagnosis not present

## 2020-11-27 DIAGNOSIS — R079 Chest pain, unspecified: Secondary | ICD-10-CM | POA: Diagnosis not present

## 2020-12-22 ENCOUNTER — Ambulatory Visit (INDEPENDENT_AMBULATORY_CARE_PROVIDER_SITE_OTHER): Payer: Medicare Other | Admitting: Nurse Practitioner

## 2020-12-22 ENCOUNTER — Encounter: Payer: Self-pay | Admitting: Nurse Practitioner

## 2020-12-22 DIAGNOSIS — U071 COVID-19: Secondary | ICD-10-CM | POA: Diagnosis not present

## 2020-12-22 MED ORDER — BENZONATATE 100 MG PO CAPS: 100.0000 mg | ORAL_CAPSULE | Freq: Three times a day (TID) | ORAL | 0 refills | Status: DC | PRN

## 2020-12-22 MED ORDER — MOLNUPIRAVIR EUA 200MG CAPSULE: 4.0000 | ORAL_CAPSULE | Freq: Two times a day (BID) | ORAL | 0 refills | Status: AC

## 2020-12-22 NOTE — Progress Notes (Signed)
Virtual Visit  Note Due to COVID-19 pandemic this visit was conducted virtually. This visit type was conducted due to national recommendations for restrictions regarding the COVID-19 Pandemic (e.g. social distancing, sheltering in place) in an effort to limit this patient's exposure and mitigate transmission in our community. All issues noted in this document were discussed and addressed.  A physical exam was not performed with this format.  I connected with Jasmine Stephens on 12/22/20 at 11:40 by telephone and verified that I am speaking with the correct person using two identifiers. Jasmine Stephens is currently located at home and 2no one is currently with her during visit. The provider, Mary-Margaret Hassell Done, FNP is located in their office at time of visit.  I discussed the limitations, risks, security and privacy concerns of performing an evaluation and management service by telephone and the availability of in person appointments. I also discussed with the patient that there may be a patient responsible charge related to this service. The patient expressed understanding and agreed to proceed.   History and Present Illness:  Patient calls in stating she developed headache and body ache with sore throat and congestion on Tuesday. Low grade fever. Cough has become productive. She did covid test test yesterday and was positive.     Review of Systems  Constitutional:  Positive for chills, fever and malaise/fatigue.  HENT:  Positive for congestion and sore throat.   Respiratory:  Positive for cough and sputum production. Negative for shortness of breath.   Musculoskeletal:  Positive for myalgias.  Neurological:  Positive for dizziness and headaches.    Observations/Objective: Alert and oriented- answers all questions appropriately No distress Voice hoarse Dry cough  Assessment and Plan: Jasmine Stephens in today with chief complaint of No chief complaint on file.   1. Lab test positive for  detection of COVID-19 virus 1. Take meds as prescribed 2. Use a cool mist humidifier especially during the winter months and when heat has been humid. 3. Use saline nose sprays frequently 4. Saline irrigations of the nose can be very helpful if done frequently.  * 4X daily for 1 week*  * Use of a nettie pot can be helpful with this. Follow directions with this* 5. Drink plenty of fluids 6. Keep thermostat turn down low 7.For any cough or congestion  Use plain Mucinex- regular strength or max strength is fine   * Children- consult with Pharmacist for dosing 8. For fever or aces or pains- take tylenol or ibuprofen appropriate for age and weight.  * for fevers greater than 101 orally you may alternate ibuprofen and tylenol every  3 hours.    - molnupiravir EUA (LAGEVRIO) 200 mg CAPS capsule; Take 4 capsules (800 mg total) by mouth 2 (two) times daily for 5 days.  Dispense: 40 capsule; Refill: 0  Quarantine for 5 days    Follow Up Instructions: prn    I discussed the assessment and treatment plan with the patient. The patient was provided an opportunity to ask questions and all were answered. The patient agreed with the plan and demonstrated an understanding of the instructions.   The patient was advised to call back or seek an in-person evaluation if the symptoms worsen or if the condition fails to improve as anticipated.  The above assessment and management plan was discussed with the patient. The patient verbalized understanding of and has agreed to the management plan. Patient is aware to call the clinic if symptoms persist or worsen. Patient is aware  when to return to the clinic for a follow-up visit. Patient educated on when it is appropriate to go to the emergency department.   Time call ended:  11:52  I provided 12 minutes of  non face-to-face time during this encounter.    Mary-Margaret Hassell Done, FNP

## 2021-02-07 ENCOUNTER — Telehealth: Payer: Self-pay | Admitting: Nurse Practitioner

## 2021-02-07 NOTE — Telephone Encounter (Signed)
  No answer unable to leave a message for patient to call back and schedule Medicare Annual Wellness Visit (AWV) to be completed by video or phone.  No hx of AWV eligible for AWVI as of 10/24/2019 per [employer]   Please schedule at anytime with [facility] Health Advisor.      64 Minutes appointment   Any questions, please call me at [phone]

## 2021-03-08 ENCOUNTER — Telehealth: Payer: Self-pay | Admitting: Nurse Practitioner

## 2021-03-08 NOTE — Telephone Encounter (Signed)
Called to schedule AWV

## 2021-04-17 ENCOUNTER — Telehealth: Payer: Self-pay | Admitting: Nurse Practitioner

## 2021-04-17 NOTE — Telephone Encounter (Signed)
Called patient to set up AWV. If patient calls back please make appt and check phone numbers on file.

## 2021-06-15 ENCOUNTER — Telehealth: Payer: Self-pay | Admitting: Cardiovascular Disease

## 2021-06-15 NOTE — Telephone Encounter (Signed)
? ? ?  Pre-operative Risk Assessment  ?  ?Patient Name: Jasmine Stephens  ?DOB: 08/09/1950 ?MRN: [id number]  ? ?  ? ?Request for Surgical Clearance   ? ?Procedure:  Dental Extraction - Amount of Teeth to be Pulled:  1 tooth ? ?Date of Surgery:  Clearance TBD                              ?   ?Surgeon:  Dr. Ledon Snare ?Surgeon's Group or Practice Name:  [facility] ?Phone number:  [phone] ?Fax number:  [phone] ?  ?Type of Clearance Requested:   ?- Medical  ?- Pharmacy:  Hold Aspirin and Clopidogrel (Plavix) defer to cards ?  ?Type of Anesthesia:   light to moderate sedation ?  ?Additional requests/questions:   pt will have a surgical extraction ? ?Signed, ?Craig   ?06/15/2021, 1:22 PM   ?

## 2021-06-15 NOTE — Telephone Encounter (Signed)
? ?  Primary Cardiologist: Quay Burow, MD ? ?Chart reviewed as part of pre-operative protocol coverage. Simple dental extractions are considered low risk procedures per guidelines and generally do not require any specific cardiac clearance. It is also generally accepted that for simple extractions and dental cleanings, there is no need to interrupt blood thinner therapy.  ? ?SBE prophylaxis is not required for the patient. ? ?I will route this recommendation to the requesting party via Epic fax function and remove from pre-op pool. ? ?Please call with questions. ? ?Deberah Pelton, NP ?06/15/2021, 1:46 PM ? ? ? ? ?

## 2021-06-19 NOTE — Telephone Encounter (Addendum)
Can you clarify if they need to hold ASA or Plavix for this? It is typically our preference to continue if able but if dentist feels this type of procedure requires holding of antiplatelets, we can revisit with cardiologist. ?

## 2021-06-19 NOTE — Telephone Encounter (Signed)
? ?  Patient Name: Jasmine Stephens  ?DOB: [birth year] ?MRN: [id number] ? ?Primary Cardiologist: Quay Burow, MD ? ?Chart reviewed as part of pre-operative protocol coverage.  ? ?Dental office requesting cardiology input on holding ASA and Plavix in anticipation of surgical tooth extraction; they are deferring to cardiologist to decide if this needs to be held since the tooth extraction is surgical and the patient will have to be cut for the procedure. Will route to Dr. Gwenlyn Found for input on holding ASA + Plavix (I.e. 5 days) prior to procedure - patient has history of CAD s/p inferior STEMI s/p overlapping DES to RCA in 11/2018, HTN, HLD. She has not required PCI since original MI in 2020. Dr. Gwenlyn Found - Please route response to P CV DIV PREOP (the pre-op pool). Thank you. ? ?Charlie Pitter, PA-C ?06/19/2021, 2:20 PM ? ? ?

## 2021-06-19 NOTE — Telephone Encounter (Signed)
Jasmine Stephens is calling back in regards to this clearance stating the letter they received back is clearing the patient for a simple dental extraction, but that is not what the patient is having performed. She states the patient is having a surgical extraction where she will be cut. Dr. Georgie Chard wanted to wanted to make the office aware of this so a new clearance can be sent for this. Please advise.  ?

## 2021-06-19 NOTE — Telephone Encounter (Signed)
I s/w Jasmine Stephens with dental office. I did confirm with her that it is a surgical extraction. I did apologize that the provider did not see it at the bottom of the clearance where it was noted. I assured Jasmine Stephens that I will re-send back to our pre op provider to please review and reflect the clearance is for a surgical extraction of 1 tooth. I assured Jasmine Stephens that we will re-send clearance with correct procedure being cleared for.  ?

## 2021-06-19 NOTE — Telephone Encounter (Signed)
I s/w Tammy with DDS office and she said they are deferring to cards about blood thinners if they need to be held or not. Tammy states the DDS wants the advice from the cardiology office. DDS feels they will follow what the cardiologist feels is the safest for the pt. I assured Tammy I will sen note back to pre op  ?

## 2021-06-19 NOTE — Telephone Encounter (Addendum)
Call placed to patient to ensure no change in status before relaying clearance to hold antiplatelet agents per Dr. Gwenlyn Found. First phone number goes to a phone tree that states the number may no longer be in service and then offers insurance products. Second number just rings multiple times, does not go to VM. Third number's office is closed and extension is invalid. Will need to try second number and third again tomorrow.  ?

## 2021-06-21 NOTE — Telephone Encounter (Signed)
? ?  Primary Cardiologist: Quay Burow, MD ? ?Chart reviewed as part of pre-operative protocol coverage. Simple dental extractions are considered low risk procedures per guidelines and generally do not require any specific cardiac clearance. It is also generally accepted that for simple extractions and dental cleanings, there is no need to interrupt blood thinner therapy.  ? ?SBE prophylaxis is not required for the patient. ? ?Case has been reviewed with Dr. Gwenlyn Found.  Her aspirin and Plavix may be held for 5-7 days prior to her procedure.  Please resume as soon as hemostasis is achieved. ? ?I will route this recommendation to the requesting party via Epic fax function and remove from pre-op pool. ? ?Please call with questions. ? ?Deberah Pelton, NP ?06/21/2021, 7:19 AM ? ? ? ? ?

## 2021-06-28 ENCOUNTER — Other Ambulatory Visit: Payer: Self-pay | Admitting: Nurse Practitioner

## 2021-06-28 DIAGNOSIS — Z1231 Encounter for screening mammogram for malignant neoplasm of breast: Secondary | ICD-10-CM

## 2021-07-16 ENCOUNTER — Inpatient Hospital Stay: Admission: RE | Admit: 2021-07-16 | Payer: Medicare Other | Source: Ambulatory Visit

## 2021-07-16 ENCOUNTER — Other Ambulatory Visit: Payer: Self-pay | Admitting: Cardiovascular Disease

## 2021-07-16 DIAGNOSIS — M4316 Spondylolisthesis, lumbar region: Secondary | ICD-10-CM | POA: Diagnosis not present

## 2021-07-16 DIAGNOSIS — M5416 Radiculopathy, lumbar region: Secondary | ICD-10-CM | POA: Diagnosis not present

## 2021-07-16 DIAGNOSIS — M25552 Pain in left hip: Secondary | ICD-10-CM | POA: Diagnosis not present

## 2021-07-25 ENCOUNTER — Ambulatory Visit
Admission: RE | Admit: 2021-07-25 | Discharge: 2021-07-25 | Disposition: A | Discharge status: Home / Self Care | Payer: Medicare Other | Source: Ambulatory Visit | Attending: Nurse Practitioner | Admitting: Nurse Practitioner

## 2021-07-25 DIAGNOSIS — Z1231 Encounter for screening mammogram for malignant neoplasm of breast: Secondary | ICD-10-CM | POA: Diagnosis not present

## 2021-08-06 ENCOUNTER — Encounter: Payer: BC Managed Care – PPO | Admitting: Nurse Practitioner

## 2021-08-07 ENCOUNTER — Encounter: Payer: Self-pay | Admitting: Nurse Practitioner

## 2021-08-16 ENCOUNTER — Encounter: Payer: BC Managed Care – PPO | Admitting: Nurse Practitioner

## 2021-08-21 ENCOUNTER — Encounter: Payer: Self-pay | Admitting: Nurse Practitioner

## 2021-09-06 ENCOUNTER — Encounter: Payer: BC Managed Care – PPO | Admitting: Nurse Practitioner

## 2021-09-11 ENCOUNTER — Other Ambulatory Visit: Payer: Self-pay | Admitting: Cardiovascular Disease

## 2021-10-10 ENCOUNTER — Ambulatory Visit: Payer: Medicare Other | Admitting: Physician Assistant

## 2021-10-12 ENCOUNTER — Encounter: Payer: Self-pay | Admitting: Nurse Practitioner

## 2021-10-12 ENCOUNTER — Ambulatory Visit (INDEPENDENT_AMBULATORY_CARE_PROVIDER_SITE_OTHER): Payer: Medicare Other | Admitting: Nurse Practitioner

## 2021-10-12 VITALS — BP 146/81 | HR 68 | Temp 96.0°F | Resp 20 | Ht 66.0 in | Wt 207.0 lb

## 2021-10-12 DIAGNOSIS — I2111 ST elevation (STEMI) myocardial infarction involving right coronary artery: Secondary | ICD-10-CM | POA: Diagnosis not present

## 2021-10-12 DIAGNOSIS — K219 Gastro-esophageal reflux disease without esophagitis: Secondary | ICD-10-CM | POA: Diagnosis not present

## 2021-10-12 DIAGNOSIS — E668 Other obesity: Secondary | ICD-10-CM

## 2021-10-12 DIAGNOSIS — R7303 Prediabetes: Secondary | ICD-10-CM | POA: Diagnosis not present

## 2021-10-12 DIAGNOSIS — K5730 Diverticulosis of large intestine without perforation or abscess without bleeding: Secondary | ICD-10-CM | POA: Diagnosis not present

## 2021-10-12 DIAGNOSIS — I2510 Atherosclerotic heart disease of native coronary artery without angina pectoris: Secondary | ICD-10-CM | POA: Diagnosis not present

## 2021-10-12 DIAGNOSIS — E785 Hyperlipidemia, unspecified: Secondary | ICD-10-CM | POA: Diagnosis not present

## 2021-10-12 DIAGNOSIS — K222 Esophageal obstruction: Secondary | ICD-10-CM

## 2021-10-12 DIAGNOSIS — Z0001 Encounter for general adult medical examination with abnormal findings: Secondary | ICD-10-CM | POA: Diagnosis not present

## 2021-10-12 DIAGNOSIS — Z9861 Coronary angioplasty status: Secondary | ICD-10-CM

## 2021-10-12 DIAGNOSIS — K582 Mixed irritable bowel syndrome: Secondary | ICD-10-CM | POA: Diagnosis not present

## 2021-10-12 DIAGNOSIS — F411 Generalized anxiety disorder: Secondary | ICD-10-CM

## 2021-10-12 MED ORDER — METOPROLOL TARTRATE 25 MG PO TABS: 12.5000 mg | ORAL_TABLET | Freq: Two times a day (BID) | ORAL | 1 refills | Status: DC

## 2021-10-12 MED ORDER — PANTOPRAZOLE SODIUM 40 MG PO TBEC: 40.0000 mg | DELAYED_RELEASE_TABLET | Freq: Every day | ORAL | 1 refills | Status: DC

## 2021-10-12 MED ORDER — LOSARTAN POTASSIUM 25 MG PO TABS: ORAL_TABLET | ORAL | 1 refills | Status: DC

## 2021-10-12 MED ORDER — ATORVASTATIN CALCIUM 80 MG PO TABS: 80.0000 mg | ORAL_TABLET | Freq: Every day | ORAL | 1 refills | Status: DC

## 2021-10-12 MED ORDER — CLOPIDOGREL BISULFATE 75 MG PO TABS: ORAL_TABLET | ORAL | 1 refills | Status: DC

## 2021-10-12 NOTE — Progress Notes (Addendum)
Subjective:    Patient ID: Jasmine Stephens, female    DOB: Apr 25, 1950, 71 y.o.   MRN: [id number]  / Chief Complaint: Annual Exam    HPI:  Jasmine Stephens is a 50 y.o. who identifies as a female who was assigned female at birth.   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 CAD S/P percutaneous coronary angioplasty 2. STEMI involving right coronary artery [facility]) Last cardiology appointment was 08/29/20. Reiew of office note show sno change in pain of care.  3. Gastroesophageal reflux disease without esophagitis Is on protonix and is doing well.  4. Diverticulosis of colon No recent flare ups  5. Irritable bowel syndrome with both constipation and diarrhea Doing well. No constipation or diarrhea  6. Stricture and stenosis of esophagus No trouble swallowing  7. Dyslipidemia, goal LDL below 70 Does not watch diet and does no dedicated exercise. Lab Results  Component Value Date   CHOL 158 08/29/2020   HDL 61 08/29/2020   LDLCALC 79 08/29/2020   TRIG 102 08/29/2020   CHOLHDL 2.6 08/29/2020     8. Prediabetes Does not check blood sugars at home  9. Anxiety state Is on no prescription meds    10/12/2021   11:22 AM  GAD 7 : Generalized Anxiety Score  Nervous, Anxious, on Edge 0  Control/stop worrying 0  Worry too much - different things 0  Trouble relaxing 0  Restless 0  Easily annoyed or irritable 0  Afraid - awful might happen 0  Total GAD 7 Score 0  Anxiety Difficulty Not difficult at all      10. Moderate obesity  Wt Readings from Last 3 Encounters:  10/12/21 207 lb (93.9 kg)  08/29/20 208 lb 6.4 oz (94.5 kg)  03/10/20 223 lb 6.4 oz (101.3 kg)   BMI Readings from Last 3 Encounters:  10/12/21 33.41 kg/m  08/29/20 32.89 kg/m  03/10/20 35.25 kg/m      New complaints: None today  Allergies  Allergen Reactions    Compazine [Prochlorperazine] Anaphylaxis   Phenothiazines Anaphylaxis   Chlorpromazine Hcl Other (See Comments)    Reaction unknown   Penicillins Other (See Comments)    Reaction unkwown Did it involve swelling of the face/tongue/throat, SOB, or low BP? Unknown Did it involve sudden or severe rash/hives, skin peeling, or any reaction on the inside of your mouth or nose? Unknown Did you need to seek medical attention at a hospital or doctor's office? Unknown When did it last happen? unk       If all above answers are "NO", may proceed with cephalosporin use.    Latex Rash    With continued exposure to latex   Tape Rash   Tapentadol Rash   Outpatient Encounter Medications as of 10/12/2021  Medication Sig   acetaminophen (TYLENOL) 500 MG tablet Take 500 mg by mouth every 6 (six) hours as needed for mild pain or headache.    aspirin 81 MG chewable tablet Chew 1 tablet (81 mg total) by mouth daily.   atorvastatin (LIPITOR) 80 MG tablet Take 1 tablet (80 mg total) by mouth daily at 6 PM.   clopidogrel (PLAVIX) 75 MG tablet TAKE ONE (1) TABLET BY MOUTH EVERY DAY   losartan (COZAAR) 25 MG tablet TAKE ONE (1) TABLET EACH DAY   meloxicam (MOBIC) 15 MG tablet Take 15 mg by mouth daily  as needed (knee pain).    metoprolol tartrate (LOPRESSOR) 25 MG tablet Take 0.5 tablets (12.5 mg total) by mouth 2 (two) times daily.   Multiple Vitamin (MULTIVITAMIN WITH MINERALS) TABS tablet Take 1 tablet by mouth daily.   nitroGLYCERIN (NITROSTAT) 0.4 MG SL tablet Place 1 tablet (0.4 mg total) under the tongue every 5 (five) minutes x 3 doses as needed for chest pain.   Omega-3 Fatty Acids (FISH OIL PO) Take 2 capsules by mouth daily.   pantoprazole (PROTONIX) 40 MG tablet Take 1 tablet (40 mg total) by mouth daily.   [DISCONTINUED] HYDROcodone-homatropine (HYCODAN) 5-1.5 MG/5ML syrup Take 5 mLs by mouth every 6 (six) hours as needed for cough.   [DISCONTINUED] benzonatate (TESSALON PERLES) 100 MG capsule Take 1  capsule (100 mg total) by mouth 3 (three) times daily as needed.   No facility-administered encounter medications on file as of 10/12/2021.    Past Surgical History:  Procedure Laterality Date   BACK SURGERY     CHOLECYSTECTOMY     COLONOSCOPY     COLONOSCOPY N/A 05/31/2013   Procedure: COLONOSCOPY;  Surgeon: Lafayette Dragon, MD;  Location: [facility];  Service: Endoscopy;  Laterality: N/A;   CORONARY/GRAFT ACUTE MI REVASCULARIZATION N/A 12/17/2018   Procedure: Coronary/Graft Acute MI Revascularization;  Surgeon: Nelva Bush, MD;  Location: [place] [facility];  Service: Cardiovascular;  Laterality: N/A;   ESOPHAGOGASTRODUODENOSCOPY N/A 05/31/2013   Procedure: ESOPHAGOGASTRODUODENOSCOPY (EGD);  Surgeon: Lafayette Dragon, MD;  Location: [facility];  Service: Endoscopy;  Laterality: N/A;   KNEE ARTHROSCOPY Right 10/2016   LEFT HEART CATH AND CORONARY ANGIOGRAPHY N/A 12/17/2018   Procedure: LEFT HEART CATH AND CORONARY ANGIOGRAPHY;  Surgeon: Nelva Bush, MD;  Location: [place] [facility];  Service: Cardiovascular;  Laterality: N/A;   MALONEY DILATION  05/31/2013   Procedure: Venia Minks DILATION;  Surgeon: Lafayette Dragon, MD;  Location: [facility];  Service: Endoscopy;;   PARTIAL HYSTERECTOMY     REPLACEMENT TOTAL KNEE Right 02/2018   UPPER GASTROINTESTINAL ENDOSCOPY      Family History  Problem Relation Age of Onset   Heart failure Mother 57   Heart disease Father 40   Colon cancer Paternal Aunt    Colon cancer Paternal Grandmother    Heart disease Brother    Breast cancer Neg Hx       Controlled substance contract: n/a     Review of Systems  Constitutional:  Negative for diaphoresis.  Eyes:  Negative for pain.  Respiratory:  Negative for shortness of breath.   Cardiovascular:  Negative for chest pain, palpitations and leg swelling.  Gastrointestinal:  Negative for abdominal pain.  Endocrine: Negative for polydipsia.  Skin:  Negative for rash.  Neurological:  Negative for  dizziness, weakness and headaches.  Hematological:  Does not bruise/bleed easily.  All other systems reviewed and are negative.      Objective:   Physical Exam Vitals and nursing note reviewed.  Constitutional:      General: She is not in acute distress.    Appearance: Normal appearance. She is well-developed.  HENT:     Head: Normocephalic.     Right Ear: Tympanic membrane normal.     Left Ear: Tympanic membrane normal.     Nose: Nose normal.     Mouth/Throat:     Mouth: Mucous membranes are moist.  Eyes:     Pupils: Pupils are equal, round, and reactive to light.  Neck:     Vascular: No carotid  bruit or JVD.  Cardiovascular:     Rate and Rhythm: Normal rate and regular rhythm.     Heart sounds: Normal heart sounds.  Pulmonary:     Effort: Pulmonary effort is normal. No respiratory distress.     Breath sounds: Normal breath sounds. No wheezing or rales.  Chest:     Chest wall: No tenderness.  Abdominal:     General: Bowel sounds are normal. There is no distension or abdominal bruit.     Palpations: Abdomen is soft. There is no hepatomegaly, splenomegaly, mass or pulsatile mass.     Tenderness: There is no abdominal tenderness.  Musculoskeletal:        General: Normal range of motion.     Cervical back: Normal range of motion and neck supple.  Lymphadenopathy:     Cervical: No cervical adenopathy.  Skin:    General: Skin is warm and dry.  Neurological:     Mental Status: She is alert and oriented to person, place, and time.     Deep Tendon Reflexes: Reflexes are normal and symmetric.  Psychiatric:        Behavior: Behavior normal.        Thought Content: Thought content normal.        Judgment: Judgment normal.     BP (!) 146/81   Pulse 68   Temp (!) 96 F (35.6 C) (Temporal)   Resp 20   Ht _0  (1.676 m)   Wt 207 lb (93.9 kg)   LMP  (LMP Unknown)   SpO2 94%   BMI 33.41 kg/m        Assessment & Plan:   Sybilla Arts comes in today with chief  complaint of Annual Exam   Diagnosis and orders addressed:  1. CAD S/P percutaneous coronary angioplasty Keep follow up with cardiology - metoprolol tartrate (LOPRESSOR) 25 MG tablet; Take 0.5 tablets (12.5 mg total) by mouth 2 (two) times daily.  Dispense: 30 tablet; Refill: 1 - clopidogrel (PLAVIX) 75 MG tablet; TAKE ONE (1) TABLET BY MOUTH EVERY DAY  Dispense: 90 tablet; Refill: 1 - losartan (COZAAR) 25 MG tablet; TAKE ONE (1) TABLET EACH DAY  Dispense: 90 tablet; Refill: 1  2. STEMI involving right coronary artery ([place])   3. Gastroesophageal reflux disease without esophagitis Avoid spicy foods Do not eat 2 hours prior to bedtime - pantoprazole (PROTONIX) 40 MG tablet; Take 1 tablet (40 mg total) by mouth daily.  Dispense: 90 tablet; Refill: 1  4. Diverticulosis of colon Watch diet to prevent flare up  5. Irritable bowel syndrome with both constipation and diarrhea  6. Stricture and stenosis of esophagus Chew food well.  7. Dyslipidemia, goal LDL below 70 Low fat diet - atorvastatin (LIPITOR) 80 MG tablet; Take 1 tablet (80 mg total) by mouth daily at 6 PM.  Dispense: 90 tablet; Refill: 1 - CBC with Differential/Platelet - CMP14+EGFR - Lipid panel  8. Prediabetes  9. Anxiety state Stress management  10. Moderate obesity Discussed diet and exercise for person with BMI >25 Will recheck weight in 3-6 months    Labs pending Health Maintenance reviewed Diet and exercise encouraged  Follow up plan: 6 months   Mary-Margaret Hassell Done, FNP

## 2021-10-12 NOTE — Patient Instructions (Signed)
Heart-Healthy Eating Plan Heart-healthy meal planning includes: Eating less unhealthy fats. Eating more healthy fats. Making other changes in your diet. Talk with your doctor or a diet specialist (dietitian) to create an eating plan that is right for you. What is my plan? Your doctor may recommend an eating plan that includes: Total fat: ______% or less of total calories a day. Saturated fat: ______% or less of total calories a day. Cholesterol: less than _________mg a day. What are tips for following this plan? Cooking Avoid frying your food. Try to bake, boil, grill, or broil it instead. You can also reduce fat by: Removing the skin from poultry. Removing all visible fats from meats. Steaming vegetables in water or broth. Meal planning  At meals, divide your plate into four equal parts: Fill one-half of your plate with vegetables and green salads. Fill one-fourth of your plate with whole grains. Fill one-fourth of your plate with lean protein foods. Eat 4-5 servings of vegetables per day. A serving of vegetables is: 1 cup of raw or cooked vegetables. 2 cups of raw leafy greens. Eat 4-5 servings of fruit per day. A serving of fruit is: 1 medium whole fruit.  cup of dried fruit.  cup of fresh, frozen, or canned fruit.  cup of 100% fruit juice. Eat more foods that have soluble fiber. These are apples, broccoli, carrots, beans, peas, and barley. Try to get 20-30 g of fiber per day. Eat 4-5 servings of nuts, legumes, and seeds per week: 1 serving of dried beans or legumes equals  cup after being cooked. 1 serving of nuts is  cup. 1 serving of seeds equals 1 tablespoon. General information Eat more home-cooked food. Eat less restaurant, buffet, and fast food. Limit or avoid alcohol. Limit foods that are high in starch and sugar. Avoid fried foods. Lose weight if you are overweight. Keep track of how much salt (sodium) you eat. This is important if you have high blood  pressure. Ask your doctor to tell you more about this. Try to add vegetarian meals each week. Fats Choose healthy fats. These include olive oil and canola oil, flaxseeds, walnuts, almonds, and seeds. Eat more omega-3 fats. These include salmon, mackerel, sardines, tuna, flaxseed oil, and ground flaxseeds. Try to eat fish at least 2 times each week. Check food labels. Avoid foods with trans fats or high amounts of saturated fat. Limit saturated fats. These are often found in animal products, such as meats, butter, and cream. These are also found in plant foods, such as palm oil, palm kernel oil, and coconut oil. Avoid foods with partially hydrogenated oils in them. These have trans fats. Examples are stick margarine, some tub margarines, cookies, crackers, and other baked goods. What foods can I eat? Fruits All fresh, canned (in natural juice), or frozen fruits. Vegetables Fresh or frozen vegetables (raw, steamed, roasted, or grilled). Green salads. Grains Most grains. Choose whole wheat and whole grains most of the time. Rice and pasta, including brown rice and pastas made with whole wheat. Meats and other proteins Lean, well-trimmed beef, veal, pork, and lamb. Chicken and turkey without skin. All fish and shellfish. Wild duck, rabbit, pheasant, and venison. Egg whites or low-cholesterol egg substitutes. Dried beans, peas, lentils, and tofu. Seeds and most nuts. Dairy Low-fat or nonfat cheeses, including ricotta and mozzarella. Skim or 1% milk that is liquid, powdered, or evaporated. Buttermilk that is made with low-fat milk. Nonfat or low-fat yogurt. Fats and oils Non-hydrogenated (trans-free) margarines. Vegetable oils, including   soybean, sesame, sunflower, olive, peanut, safflower, corn, canola, and cottonseed. Salad dressings or mayonnaise made with a vegetable oil. Beverages Mineral water. Coffee and tea. Diet carbonated beverages. Sweets and desserts Sherbet, gelatin, and fruit ice.  Small amounts of dark chocolate. Limit all sweets and desserts. Seasonings and condiments All seasonings and condiments. The items listed above may not be a complete list of foods and drinks you can eat. Contact a dietitian for more options. What foods should I avoid? Fruits Canned fruit in heavy syrup. Fruit in cream or butter sauce. Fried fruit. Limit coconut. Vegetables Vegetables cooked in cheese, cream, or butter sauce. Fried vegetables. Grains Breads that are made with saturated or trans fats, oils, or whole milk. Croissants. Sweet rolls. Donuts. High-fat crackers, such as cheese crackers. Meats and other proteins Fatty meats, such as hot dogs, ribs, sausage, bacon, rib-eye roast or steak. High-fat deli meats, such as salami and bologna. Caviar. Domestic duck and goose. Organ meats, such as liver. Dairy Cream, sour cream, cream cheese, and creamed cottage cheese. Whole-milk cheeses. Whole or 2% milk that is liquid, evaporated, or condensed. Whole buttermilk. Cream sauce or high-fat cheese sauce. Yogurt that is made from whole milk. Fats and oils Meat fat, or shortening. Cocoa butter, hydrogenated oils, palm oil, coconut oil, palm kernel oil. Solid fats and shortenings, including bacon fat, salt pork, lard, and butter. Nondairy cream substitutes. Salad dressings with cheese or sour cream. Beverages Regular sodas and juice drinks with added sugar. Sweets and desserts Frosting. Pudding. Cookies. Cakes. Pies. Milk chocolate or white chocolate. Buttered syrups. Full-fat ice cream or ice cream drinks. The items listed above may not be a complete list of foods and drinks to avoid. Contact a dietitian for more information. Summary Heart-healthy meal planning includes eating less unhealthy fats, eating more healthy fats, and making other changes in your diet. Eat a balanced diet. This includes fruits and vegetables, low-fat or nonfat dairy, lean protein, nuts and legumes, whole grains, and  heart-healthy oils and fats. This information is not intended to replace advice given to you by your health care provider. Make sure you discuss any questions you have with your health care provider. Document Revised: 07/20/2020 Document Reviewed: 07/20/2020 Elsevier Patient Education  2022 Elsevier Inc.  

## 2021-11-08 ENCOUNTER — Ambulatory Visit: Payer: Medicare Other | Admitting: Physician Assistant

## 2021-12-05 ENCOUNTER — Encounter: Payer: Self-pay | Admitting: Gastroenterology

## 2022-04-15 ENCOUNTER — Encounter: Payer: Self-pay | Admitting: Nurse Practitioner

## 2022-04-15 ENCOUNTER — Ambulatory Visit: Payer: BC Managed Care – PPO | Admitting: Nurse Practitioner

## 2022-04-15 NOTE — Progress Notes (Deleted)
Subjective:    Patient ID: Jasmine Stephens, female    DOB: [birth year], 72 y.o.   MRN: [id number]   Chief Complaint: medical management of chronic issues     HPI:  Jasmine Stephens is a 38 y.o. who identifies as a female who was assigned female at birth.   Social history: Lives with: husband Work history: retired   Scientist, forensic in today for follow up of the following chronic medical issues:  1. CAD S/P percutaneous coronary angioplasty 2. STEMI involving right coronary artery [facility]) Last saw cardiology on 08/29/21. Only change is she was transitioned from brilinta  to plavix- is doing well  3. Diverticulosis of colon No recent flare ups  4. Gastroesophageal reflux disease without esophagitis Is on protonix daily.   5. Irritable bowel syndrome with both constipation and diarrhea Sees dr. Watt Climes several times a year  6. Dyslipidemia, goal LDL below 70 Does not really watch diet and does no exercise. Lab Results  Component Value Date   CHOL 158 08/29/2020   HDL 61 08/29/2020   LDLCALC 79 08/29/2020   TRIG 102 08/29/2020   CHOLHDL 2.6 08/29/2020     7. Prediabetes Does not check blood sugars at [place]. Lab Results  Component Value Date   HGBA1C 6.0 (H) 12/17/2018     8. Anxiety state On no anxiety meds      9. Moderate obesity No recent weight changes   New complaints: ***  Allergies  Allergen Reactions   Compazine [Prochlorperazine] Anaphylaxis   Phenothiazines Anaphylaxis   Chlorpromazine Hcl Other (See Comments)    Reaction unknown   Penicillins Other (See Comments)    Reaction unkwown Did it involve swelling of the face/tongue/throat, SOB, or low BP? Unknown Did it involve sudden or severe rash/hives, skin peeling, or any reaction on the inside of your mouth or nose? Unknown Did you need to seek medical attention at a hospital or doctor's office? Unknown When did it last happen? unk       If all above answers are "NO", may proceed with cephalosporin use.     Latex Rash    With continued exposure to latex   Tape Rash   Tapentadol Rash   Outpatient Encounter Medications as of 04/15/2022  Medication Sig   acetaminophen (TYLENOL) 500 MG tablet Take 500 mg by mouth every 6 (six) hours as needed for mild pain or headache.    aspirin 81 MG chewable tablet Chew 1 tablet (81 mg total) by mouth daily.   atorvastatin (LIPITOR) 80 MG tablet Take 1 tablet (80 mg total) by mouth daily at 6 PM.   clopidogrel (PLAVIX) 75 MG tablet TAKE ONE (1) TABLET BY MOUTH EVERY DAY   losartan (COZAAR) 25 MG tablet TAKE ONE (1) TABLET EACH DAY   meloxicam (MOBIC) 15 MG tablet Take 15 mg by mouth daily as needed (knee pain).    metoprolol tartrate (LOPRESSOR) 25 MG tablet Take 0.5 tablets (12.5 mg total) by mouth 2 (two) times daily.   Multiple Vitamin (MULTIVITAMIN WITH MINERALS) TABS tablet Take 1 tablet by mouth daily.   nitroGLYCERIN (NITROSTAT) 0.4 MG SL tablet Place 1 tablet (0.4 mg total) under the tongue every 5 (five) minutes x 3 doses as needed for chest pain.   Omega-3 Fatty Acids (FISH OIL PO) Take 2 capsules by mouth daily.   pantoprazole (PROTONIX) 40 MG tablet Take 1 tablet (40 mg total) by mouth daily.   No facility-administered encounter medications on file as of 04/15/2022.  Past Surgical History:  Procedure Laterality Date   BACK SURGERY     CHOLECYSTECTOMY     COLONOSCOPY     COLONOSCOPY N/A 05/31/2013   Procedure: COLONOSCOPY;  Surgeon: Lafayette Dragon, MD;  Location: [facility];  Service: Endoscopy;  Laterality: N/A;   CORONARY/GRAFT ACUTE MI REVASCULARIZATION N/A 12/17/2018   Procedure: Coronary/Graft Acute MI Revascularization;  Surgeon: Nelva Bush, MD;  Location: [place] [facility];  Service: Cardiovascular;  Laterality: N/A;   ESOPHAGOGASTRODUODENOSCOPY N/A 05/31/2013   Procedure: ESOPHAGOGASTRODUODENOSCOPY (EGD);  Surgeon: Lafayette Dragon, MD;  Location: [facility];  Service: Endoscopy;  Laterality: N/A;   KNEE ARTHROSCOPY Right 10/2016    LEFT HEART CATH AND CORONARY ANGIOGRAPHY N/A 12/17/2018   Procedure: LEFT HEART CATH AND CORONARY ANGIOGRAPHY;  Surgeon: Nelva Bush, MD;  Location: [place] [facility];  Service: Cardiovascular;  Laterality: N/A;   MALONEY DILATION  05/31/2013   Procedure: Venia Minks DILATION;  Surgeon: Lafayette Dragon, MD;  [facility]: [facility];  Service: Endoscopy;;   PARTIAL HYSTERECTOMY     REPLACEMENT TOTAL KNEE Right 02/2018   UPPER GASTROINTESTINAL ENDOSCOPY      Family History  Problem Relation Age of Onset   Heart failure Mother 29   Heart disease Father 25   Colon cancer Paternal Aunt    Colon cancer Paternal Grandmother    Heart disease Brother    Breast cancer Neg Hx       Controlled substance contract: ***     Review of Systems     Objective:   Physical Exam        Assessment & Plan:

## 2022-04-25 ENCOUNTER — Other Ambulatory Visit: Payer: Self-pay | Admitting: Nurse Practitioner

## 2022-04-25 DIAGNOSIS — I2510 Atherosclerotic heart disease of native coronary artery without angina pectoris: Secondary | ICD-10-CM

## 2022-04-30 ENCOUNTER — Encounter: Payer: Self-pay | Admitting: Nurse Practitioner

## 2022-04-30 ENCOUNTER — Other Ambulatory Visit: Payer: Self-pay | Admitting: Nurse Practitioner

## 2022-04-30 DIAGNOSIS — I2510 Atherosclerotic heart disease of native coronary artery without angina pectoris: Secondary | ICD-10-CM

## 2022-04-30 NOTE — Telephone Encounter (Signed)
Letter mailed to patient.

## 2022-04-30 NOTE — Telephone Encounter (Signed)
MMM NTBS 30 days given 04/25/22

## 2022-05-01 ENCOUNTER — Ambulatory Visit: Payer: Self-pay | Admitting: Cardiovascular Disease

## 2022-05-03 ENCOUNTER — Encounter: Payer: Self-pay | Admitting: Cardiovascular Disease

## 2022-05-03 ENCOUNTER — Ambulatory Visit: Payer: Medicare HMO | Attending: Cardiovascular Disease | Admitting: Cardiovascular Disease

## 2022-05-03 VITALS — BP 130/78 | HR 59 | Ht 66.0 in | Wt 209.0 lb

## 2022-05-03 DIAGNOSIS — I2111 ST elevation (STEMI) myocardial infarction involving right coronary artery: Secondary | ICD-10-CM

## 2022-05-03 DIAGNOSIS — E785 Hyperlipidemia, unspecified: Secondary | ICD-10-CM

## 2022-05-03 DIAGNOSIS — E668 Other obesity: Secondary | ICD-10-CM | POA: Diagnosis not present

## 2022-05-03 NOTE — Patient Instructions (Signed)
Medication Instructions:  No changes *If you need a refill on your cardiac medications before your next appointment, please call your pharmacy*   Lab Work: Lipid and liver panel- today If you have labs (blood work) drawn today and your tests are completely normal, you will receive your results only by: [place] (if you have MyChart) OR A paper copy in the mail If you have any lab test that is abnormal or we need to change your treatment, we will call you to review the results.  Follow-Up: At [facility], you and your health needs are our priority.  As part of our continuing mission to provide you with exceptional heart care, we have created designated Provider Care Teams.  These Care Teams include your primary Cardiologist (physician) and Advanced Practice Providers (APPs -  Physician Assistants and Nurse Practitioners) who all work together to provide you with the care you need, when you need it.  We recommend signing up for the patient portal called "MyChart".  Sign up information is provided on this After Visit Summary.  MyChart is used to connect with patients for Virtual Visits (Telemedicine).  Patients are able to view lab/test results, encounter notes, upcoming appointments, etc.  Non-urgent messages can be sent to your provider as well.   To learn more about what you can do with MyChart, go to [email or web address].    Your next appointment:   1 year(s)  Provider:   Quay Burow, MD

## 2022-05-03 NOTE — Assessment & Plan Note (Signed)
History of CAD status post inferior STEMI 12/13/2018 treated with PCI and drug-eluting stenting of an occluded dominant RCA.  She had no significant disease otherwise.  She did have inferior hypokinesia on left ventriculogram although her 2D echo at that time was entirely normal.  She remains on dual antiplatelet therapy.  She denies chest pain or shortness of breath.

## 2022-05-03 NOTE — Assessment & Plan Note (Signed)
History of dyslipidemia on high-dose atorvastatin with lipid profile performed 08/29/2020 revealing total cholesterol 158, LDL 79 HDL 61.  I am going to repeat a fasting lipid liver profile this morning.

## 2022-05-03 NOTE — Progress Notes (Signed)
05/03/2022 Jasmine Stephens   Aug 01, 1950  [id number]  Primary Physician Hassell Done Mary-Margaret, FNP Primary Cardiologist: Lorretta Harp MD Garret Reddish, [place], [place]  HPI:  Jasmine Stephens is a 72 y.o.  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  Her mother was Jasmine Stephens, a patient of mine who died in 2006-06-05 .  I am being seen at her request because of my relationship with her mother for her first MD post hospital visit.  Her primary provider is Ronnald Collum, NP at [facility].  I last saw her in the office 08/29/2020.   Her risk factors include treated hypertension hyperlipidemia.  She has a strong family history with a father who had myocardial infarction and died.  Mother had CAD and a brother had an MI in his 32s.  She had inferior STEMI 12/17/2018 treated with PCI and overlapping drug-eluting stents in her mid RCA by Dr. Saunders Revel with otherwise no significant remaining CAD and normal global LV function with an inferior wall motion abnormality.  She was discharged home 2 days later on low-dose aspirin, Brilinta, high-dose statin therapy, losartan and beta-blocker.     Since I saw her in the office a year and a half ago she continues to do well.  Her weight has remained stable.  She denies chest pain or shortness of breath.   Current Meds  Medication Sig   acetaminophen (TYLENOL) 500 MG tablet Take 500 mg by mouth every 6 (six) hours as needed for mild pain or headache.    atorvastatin (LIPITOR) 80 MG tablet Take 1 tablet (80 mg total) by mouth daily at 6 PM.   clopidogrel (PLAVIX) 75 MG tablet TAKE ONE (1) TABLET BY MOUTH EVERY DAY (NEEDS TO BE SEEN BEFORE NEXT REFILL)   losartan (COZAAR) 25 MG tablet TAKE ONE (1) TABLET EACH DAY   metoprolol tartrate (LOPRESSOR) 25 MG tablet Take 0.5 tablets (12.5 mg total) by mouth 2 (two) times daily. (NEEDS TO BE SEEN BEFORE  NEXT REFILL)   Multiple Vitamin (MULTIVITAMIN WITH MINERALS) TABS tablet Take 1 tablet by mouth daily.   nitroGLYCERIN (NITROSTAT) 0.4 MG SL tablet Place 1 tablet (0.4 mg total) under the tongue every 5 (five) minutes x 3 doses as needed for chest pain.   Omega-3 Fatty Acids (FISH OIL PO) Take 2 capsules by mouth daily.   pantoprazole (PROTONIX) 40 MG tablet Take 1 tablet (40 mg total) by mouth daily.     Allergies  Allergen Reactions   Compazine [Prochlorperazine] Anaphylaxis   Phenothiazines Anaphylaxis   Chlorpromazine Hcl Other (See Comments)    Reaction unknown   Penicillins Other (See Comments)    Reaction unkwown Did it involve swelling of the face/tongue/throat, SOB, or low BP? Unknown Did it involve sudden or severe rash/hives, skin peeling, or any reaction on the inside of your mouth or nose? Unknown Did you need to seek medical attention at a hospital or doctor's office? Unknown When did it last happen? unk       If all above answers are "NO", may proceed with cephalosporin use.    Latex Rash    With continued exposure to latex   Tape Rash   Tapentadol Rash    Social History   Socioeconomic History   Marital status: Married    Spouse name: Not on file   Number of children: 3   Years of  education: Not on file   Highest education level: Not on file  Occupational History   Occupation: Retired  Tobacco Use   Smoking status: Never   Smokeless tobacco: Never  Vaping Use   Vaping Use: Never used  Substance and Sexual Activity   Alcohol use: Yes    Comment: occasional wine   Drug use: No   Sexual activity: Not on file  Other Topics Concern   Not on file  Social History Narrative   Lives in [place].    Social Determinants of Health   Financial Resource Strain: Low Risk  (12/17/2018)   Overall Financial Resource Strain (CARDIA)    Difficulty of Paying Living Expenses: Not hard at all  Food Insecurity: Unknown (12/17/2018)   Hunger Vital Sign    Worried About  Running Out of Food in the Last Year: Never true    Ran Out of Food in the Last Year: Not on file  Transportation Needs: Not on file  Physical Activity: Not on file  Stress: Not on file  Social Connections: Not on file  Intimate Partner Violence: Not on file     Review of Systems: General: negative for chills, fever, night sweats or weight changes.  Cardiovascular: negative for chest pain, dyspnea on exertion, edema, orthopnea, palpitations, paroxysmal nocturnal dyspnea or shortness of breath Dermatological: negative for rash Respiratory: negative for cough or wheezing Urologic: negative for hematuria Abdominal: negative for nausea, vomiting, diarrhea, bright red blood per rectum, melena, or hematemesis Neurologic: negative for visual changes, syncope, or dizziness All other systems reviewed and are otherwise negative except as noted above.    Blood pressure 130/78, pulse (!) 59, height 5' 6"$  (1.676 m), weight 209 lb (94.8 kg).  General appearance: alert and no distress Neck: no adenopathy, no carotid bruit, no JVD, supple, symmetrical, trachea midline, and thyroid not enlarged, symmetric, no tenderness/mass/nodules Lungs: clear to auscultation bilaterally Heart: regular rate and rhythm, S1, S2 normal, no murmur, click, rub or gallop Extremities: extremities normal, atraumatic, no cyanosis or edema Pulses: 2+ and symmetric Skin: Skin color, texture, turgor normal. No rashes or lesions Neurologic: Grossly normal  EKG sinus bradycardia 59 with incomplete right bundle branch block.  I personally reviewed this EKG.  ASSESSMENT AND PLAN:   Dyslipidemia, goal LDL below 70 History of dyslipidemia on high-dose atorvastatin with lipid profile performed 08/29/2020 revealing total cholesterol 158, LDL 79 HDL 61.  I am going to repeat a fasting lipid liver profile this morning.  STEMI involving right coronary artery [facility]) History of CAD status post inferior STEMI 12/13/2018 treated with PCI  and drug-eluting stenting of an occluded dominant RCA.  She had no significant disease otherwise.  She did have inferior hypokinesia on left ventriculogram although her 2D echo at that time was entirely normal.  She remains on dual antiplatelet therapy.  She denies chest pain or shortness of breath.  Moderate obesity Moderate obesity with a BMI of 34.  She expresses frustration at not being able to lose weight.  I am referring her to the [facility] diet wellness center for physician assisted weight loss.     Lorretta Harp MD FACP,FACC,FAHA, [facility] 05/03/2022 9:41 AM

## 2022-05-03 NOTE — Assessment & Plan Note (Signed)
Moderate obesity with a BMI of 34.  She expresses frustration at not being able to lose weight.  I am referring her to the [facility] diet wellness center for physician assisted weight loss.

## 2022-05-04 LAB — LIPID PANEL
Chol/HDL Ratio: 2.6 ratio (ref 0.0–4.4)
Cholesterol, Total: 143 mg/dL (ref 100–199)
HDL: 54 mg/dL (ref >39)
LDL Chol Calc (NIH): 69 mg/dL (ref 0–99)
Triglycerides: 111 mg/dL (ref 0–149)
VLDL Cholesterol Cal: 20 mg/dL (ref 5–40)

## 2022-05-04 LAB — HEPATIC FUNCTION PANEL
ALT: 39 IU/L — ABNORMAL HIGH (ref 0–32)
AST: 36 IU/L (ref 0–40)
Albumin: 4.4 g/dL (ref 3.9–4.9)
Alkaline Phosphatase: 90 IU/L (ref 44–121)
Bilirubin Total: 0.2 mg/dL (ref 0.0–1.2)
Bilirubin, Direct: <0.1 mg/dL (ref 0.00–0.40)
Total Protein: 7.1 g/dL (ref 6.0–8.5)

## 2022-07-10 ENCOUNTER — Other Ambulatory Visit: Payer: Self-pay | Admitting: Nurse Practitioner

## 2022-07-10 ENCOUNTER — Other Ambulatory Visit: Payer: Self-pay | Admitting: Cardiovascular Disease

## 2022-07-10 DIAGNOSIS — I2510 Atherosclerotic heart disease of native coronary artery without angina pectoris: Secondary | ICD-10-CM

## 2022-07-11 ENCOUNTER — Encounter: Payer: Self-pay | Admitting: Nurse Practitioner

## 2022-07-11 NOTE — Telephone Encounter (Signed)
Called pt to schedule appt N/A VM FULL Letter mailed

## 2022-07-11 NOTE — Telephone Encounter (Signed)
MMM pt NTBS 30-d given 04/25/22

## 2022-07-26 ENCOUNTER — Telehealth (INDEPENDENT_AMBULATORY_CARE_PROVIDER_SITE_OTHER): Payer: BC Managed Care – PPO | Admitting: Family Medicine

## 2022-07-26 NOTE — Progress Notes (Signed)
After multiple attempts of trying to talk the patient through it, she is unable to connect with video, she will either have to go to urgent care or reschedule.

## 2022-08-02 ENCOUNTER — Ambulatory Visit (INDEPENDENT_AMBULATORY_CARE_PROVIDER_SITE_OTHER): Payer: Medicare HMO | Admitting: Family

## 2022-08-02 ENCOUNTER — Encounter: Payer: Self-pay | Admitting: Family

## 2022-08-02 VITALS — BP 126/79 | HR 67 | Temp 97.9°F | Ht 66.0 in | Wt 208.0 lb

## 2022-08-02 DIAGNOSIS — B9689 Other specified bacterial agents as the cause of diseases classified elsewhere: Secondary | ICD-10-CM | POA: Diagnosis not present

## 2022-08-02 DIAGNOSIS — J208 Acute bronchitis due to other specified organisms: Secondary | ICD-10-CM

## 2022-08-02 MED ORDER — PREDNISONE 20 MG PO TABS: 40.0000 mg | ORAL_TABLET | Freq: Every day | ORAL | 0 refills | Status: AC

## 2022-08-02 MED ORDER — BENZONATATE 200 MG PO CAPS: 200.0000 mg | ORAL_CAPSULE | Freq: Three times a day (TID) | ORAL | 1 refills | Status: DC | PRN

## 2022-08-02 MED ORDER — GUAIFENESIN ER 600 MG PO TB12: 1200.0000 mg | ORAL_TABLET | Freq: Two times a day (BID) | ORAL | 1 refills | Status: DC

## 2022-08-02 MED ORDER — DOXYCYCLINE HYCLATE 100 MG PO TABS: 100.0000 mg | ORAL_TABLET | Freq: Two times a day (BID) | ORAL | 0 refills | Status: DC

## 2022-08-02 NOTE — Progress Notes (Signed)
Subjective:    Patient ID: Jasmine Stephens, female    DOB: [birth year], 72 y.o.   MRN: [id number]  Chief Complaint  Patient presents with   Cough   Pt presents to the office today with a cough that started two weeks ago.  Cough This is a new problem. The current episode started 1 to 4 weeks ago. The problem has been gradually worsening. The problem occurs every few minutes. The cough is Productive of sputum. Associated symptoms include ear congestion, ear pain, a fever (improved), headaches, nasal congestion, postnasal drip, shortness of breath and wheezing. Pertinent negatives include no chills or myalgias. She has tried rest and OTC cough suppressant for the symptoms. The treatment provided mild relief.      Review of Systems  Constitutional:  Positive for fever (improved). Negative for chills.  HENT:  Positive for ear pain and postnasal drip.   Respiratory:  Positive for cough, shortness of breath and wheezing.   Musculoskeletal:  Negative for myalgias.  Neurological:  Positive for headaches.  All other systems reviewed and are negative.      Objective:   Physical Exam Vitals reviewed.  Constitutional:      General: She is not in acute distress.    Appearance: She is well-developed. She is obese.  HENT:     Head: Normocephalic and atraumatic.     Right Ear: Tympanic membrane normal.     Left Ear: Tympanic membrane normal.  Eyes:     Pupils: Pupils are equal, round, and reactive to light.  Neck:     Thyroid: No thyromegaly.  Cardiovascular:     Rate and Rhythm: Normal rate and regular rhythm.     Heart sounds: Normal heart sounds. No murmur heard. Pulmonary:     Effort: Pulmonary effort is normal. No respiratory distress.     Breath sounds: Rhonchi present. No wheezing.  Abdominal:     General: Bowel sounds are normal. There is no distension.     Palpations: Abdomen is soft.     Tenderness: There is no abdominal tenderness.  Musculoskeletal:        General: No  tenderness. Normal range of motion.     Cervical back: Normal range of motion and neck supple.  Skin:    General: Skin is warm and dry.  Neurological:     Mental Status: She is alert and oriented to person, place, and time.     Cranial Nerves: No cranial nerve deficit.     Deep Tendon Reflexes: Reflexes are normal and symmetric.  Psychiatric:        Behavior: Behavior normal.        Thought Content: Thought content normal.        Judgment: Judgment normal.       BP 126/79   Pulse 67   Temp 97.9 F (36.6 C)   Ht 5\' 6"  (1.676 m)   Wt 208 lb (94.3 kg)   LMP  (LMP Unknown)   SpO2 90%   BMI 33.57 kg/m      Assessment & Plan:   Jasmine Stephens comes in today with chief complaint of Cough   Diagnosis and orders addressed:  1. Acute bacterial bronchitis - Take meds as prescribed - Use a cool mist humidifier  -Use saline nose sprays frequently -Force fluids -For any cough or congestion  Use plain Mucinex- regular strength or max strength is fine -For fever or aces or pains- take tylenol or ibuprofen. -Throat lozenges if help -Follow up  if symptoms worsen or do not improve  - doxycycline (VIBRA-TABS) 100 MG tablet; Take 1 tablet (100 mg total) by mouth 2 (two) times daily.  Dispense: 20 tablet; Refill: 0 - predniSONE (DELTASONE) 20 MG tablet; Take 2 tablets (40 mg total) by mouth daily with breakfast for 5 days.  Dispense: 10 tablet; Refill: 0 - guaiFENesin (MUCINEX) 600 MG 12 hr tablet; Take 2 tablets (1,200 mg total) by mouth 2 (two) times daily.  Dispense: 20 tablet; Refill: 1 - benzonatate (TESSALON) 200 MG capsule; Take 1 capsule (200 mg total) by mouth 3 (three) times daily as needed.  Dispense: 30 capsule; Refill: 1   Jannifer Rodney, FNP

## 2022-08-02 NOTE — Patient Instructions (Signed)

## 2022-08-03 ENCOUNTER — Other Ambulatory Visit: Payer: Self-pay | Admitting: Nurse Practitioner

## 2022-08-03 DIAGNOSIS — I2510 Atherosclerotic heart disease of native coronary artery without angina pectoris: Secondary | ICD-10-CM

## 2022-08-03 DIAGNOSIS — K219 Gastro-esophageal reflux disease without esophagitis: Secondary | ICD-10-CM

## 2022-08-05 ENCOUNTER — Encounter: Payer: Self-pay | Admitting: Nurse Practitioner

## 2022-08-05 NOTE — Telephone Encounter (Signed)
MMM pt NTBS 30-d given 04/25/22 

## 2022-08-05 NOTE — Telephone Encounter (Signed)
Left message for pt to schedule appt to get meds refilled and I will mail letter

## 2022-08-14 ENCOUNTER — Other Ambulatory Visit: Payer: Self-pay | Admitting: Nurse Practitioner

## 2022-08-14 DIAGNOSIS — I2510 Atherosclerotic heart disease of native coronary artery without angina pectoris: Secondary | ICD-10-CM

## 2022-08-27 ENCOUNTER — Telehealth: Payer: Self-pay | Admitting: Cardiovascular Disease

## 2022-08-27 ENCOUNTER — Other Ambulatory Visit: Payer: Self-pay | Admitting: Cardiovascular Disease

## 2022-08-27 DIAGNOSIS — I2510 Atherosclerotic heart disease of native coronary artery without angina pectoris: Secondary | ICD-10-CM

## 2022-08-27 NOTE — Telephone Encounter (Signed)
Called informed patient medication was refilled earlier today. 90 day suplly with 3 refills. Patient verbalized understanding.

## 2022-08-27 NOTE — Telephone Encounter (Signed)
Pt c/o medication issue:  1. Name of Medication: Metoprolol  2. How are you currently taking this medication (dosage and times per day)? 2 times a day 1/2 tablet  3. Are you having a reaction (difficulty breathing--STAT)?   4. What is your medication issue? Patient wants to know if she needs to continue taking this medicine? If she needs to continue takingit, please call in a new prescription please.Marland Kitchen

## 2022-09-19 ENCOUNTER — Other Ambulatory Visit: Payer: Self-pay | Admitting: Nurse Practitioner

## 2022-09-19 DIAGNOSIS — I2510 Atherosclerotic heart disease of native coronary artery without angina pectoris: Secondary | ICD-10-CM

## 2022-10-14 ENCOUNTER — Other Ambulatory Visit: Payer: Self-pay | Admitting: Cardiovascular Disease

## 2022-10-14 DIAGNOSIS — I2510 Atherosclerotic heart disease of native coronary artery without angina pectoris: Secondary | ICD-10-CM

## 2022-10-14 DIAGNOSIS — Z9861 Coronary angioplasty status: Secondary | ICD-10-CM

## 2022-10-17 ENCOUNTER — Ambulatory Visit: Payer: BC Managed Care – PPO | Admitting: Nurse Practitioner

## 2022-12-17 ENCOUNTER — Encounter: Payer: Self-pay | Admitting: Nurse Practitioner

## 2023-01-14 DIAGNOSIS — M1990 Unspecified osteoarthritis, unspecified site: Secondary | ICD-10-CM | POA: Diagnosis not present

## 2023-01-14 DIAGNOSIS — Z6831 Body mass index (BMI) 31.0-31.9, adult: Secondary | ICD-10-CM | POA: Diagnosis not present

## 2023-01-14 DIAGNOSIS — Z888 Allergy status to other drugs, medicaments and biological substances status: Secondary | ICD-10-CM | POA: Diagnosis not present

## 2023-01-14 DIAGNOSIS — Z7902 Long term (current) use of antithrombotics/antiplatelets: Secondary | ICD-10-CM | POA: Diagnosis not present

## 2023-01-14 DIAGNOSIS — K219 Gastro-esophageal reflux disease without esophagitis: Secondary | ICD-10-CM | POA: Diagnosis not present

## 2023-01-14 DIAGNOSIS — Z87892 Personal history of anaphylaxis: Secondary | ICD-10-CM | POA: Diagnosis not present

## 2023-01-14 DIAGNOSIS — I10 Essential (primary) hypertension: Secondary | ICD-10-CM | POA: Diagnosis not present

## 2023-01-14 DIAGNOSIS — I25119 Atherosclerotic heart disease of native coronary artery with unspecified angina pectoris: Secondary | ICD-10-CM | POA: Diagnosis not present

## 2023-01-14 DIAGNOSIS — R059 Cough, unspecified: Secondary | ICD-10-CM | POA: Diagnosis not present

## 2023-01-14 DIAGNOSIS — Z008 Encounter for other general examination: Secondary | ICD-10-CM | POA: Diagnosis not present

## 2023-01-14 DIAGNOSIS — I252 Old myocardial infarction: Secondary | ICD-10-CM | POA: Diagnosis not present

## 2023-01-14 DIAGNOSIS — E785 Hyperlipidemia, unspecified: Secondary | ICD-10-CM | POA: Diagnosis not present

## 2023-01-14 DIAGNOSIS — Z8249 Family history of ischemic heart disease and other diseases of the circulatory system: Secondary | ICD-10-CM | POA: Diagnosis not present

## 2023-01-30 ENCOUNTER — Other Ambulatory Visit: Payer: Self-pay | Admitting: Nurse Practitioner

## 2023-01-30 DIAGNOSIS — Z1231 Encounter for screening mammogram for malignant neoplasm of breast: Secondary | ICD-10-CM

## 2023-02-12 ENCOUNTER — Ambulatory Visit
Admission: RE | Admit: 2023-02-12 | Discharge: 2023-02-12 | Disposition: A | Discharge status: Home / Self Care | Payer: Medicare HMO | Source: Ambulatory Visit | Attending: Nurse Practitioner | Admitting: Nurse Practitioner

## 2023-02-12 DIAGNOSIS — Z1231 Encounter for screening mammogram for malignant neoplasm of breast: Secondary | ICD-10-CM | POA: Diagnosis not present

## 2023-02-17 DIAGNOSIS — H5203 Hypermetropia, bilateral: Secondary | ICD-10-CM | POA: Diagnosis not present

## 2023-02-19 ENCOUNTER — Other Ambulatory Visit: Payer: Self-pay | Admitting: Nurse Practitioner

## 2023-02-19 ENCOUNTER — Other Ambulatory Visit: Payer: Self-pay | Admitting: Cardiovascular Disease

## 2023-02-19 DIAGNOSIS — E785 Hyperlipidemia, unspecified: Secondary | ICD-10-CM

## 2023-02-19 DIAGNOSIS — K219 Gastro-esophageal reflux disease without esophagitis: Secondary | ICD-10-CM

## 2023-06-08 ENCOUNTER — Other Ambulatory Visit: Payer: Self-pay | Admitting: Cardiovascular Disease

## 2023-06-08 DIAGNOSIS — I2510 Atherosclerotic heart disease of native coronary artery without angina pectoris: Secondary | ICD-10-CM

## 2023-06-29 ENCOUNTER — Other Ambulatory Visit: Payer: Self-pay

## 2023-06-29 ENCOUNTER — Emergency Department (HOSPITAL_COMMUNITY)
Admission: EM | Admit: 2023-06-29 | Discharge: 2023-06-29 | Discharge status: Against Medical Advice | Attending: Emergency Medicine | Admitting: Emergency Medicine

## 2023-06-29 ENCOUNTER — Encounter (HOSPITAL_COMMUNITY): Payer: Self-pay

## 2023-06-29 DIAGNOSIS — R109 Unspecified abdominal pain: Secondary | ICD-10-CM | POA: Diagnosis not present

## 2023-06-29 DIAGNOSIS — R6883 Chills (without fever): Secondary | ICD-10-CM | POA: Diagnosis not present

## 2023-06-29 DIAGNOSIS — R110 Nausea: Secondary | ICD-10-CM | POA: Diagnosis not present

## 2023-06-29 DIAGNOSIS — Z5321 Procedure and treatment not carried out due to patient leaving prior to being seen by health care provider: Secondary | ICD-10-CM | POA: Diagnosis not present

## 2023-06-29 LAB — CBC
HCT: 39.7 % (ref 36.0–46.0)
Hemoglobin: 13.3 g/dL (ref 12.0–15.0)
MCH: 30.5 pg (ref 26.0–34.0)
MCHC: 33.5 g/dL (ref 30.0–36.0)
MCV: 91.1 fL (ref 80.0–100.0)
Platelets: 318 10*3/uL (ref 150–400)
RBC: 4.36 MIL/uL (ref 3.87–5.11)
RDW: 12.8 % (ref 11.5–15.5)
WBC: 12.6 10*3/uL — ABNORMAL HIGH (ref 4.0–10.5)
nRBC: 0 % (ref 0.0–0.2)

## 2023-06-29 LAB — URINALYSIS, ROUTINE W REFLEX MICROSCOPIC
Bilirubin Urine: NEGATIVE
Glucose, UA: NEGATIVE mg/dL
Ketones, ur: NEGATIVE mg/dL
Nitrite: NEGATIVE
Protein, ur: NEGATIVE mg/dL
Specific Gravity, Urine: 1.005 (ref 1.005–1.030)
WBC, UA: >50 WBC/hpf (ref 0–5)
pH: 6 (ref 5.0–8.0)

## 2023-06-29 LAB — BASIC METABOLIC PANEL WITH GFR
Anion gap: 15 (ref 5–15)
BUN: 15 mg/dL (ref 8–23)
CO2: 23 mmol/L (ref 22–32)
Calcium: 9.7 mg/dL (ref 8.9–10.3)
Chloride: 97 mmol/L — ABNORMAL LOW (ref 98–111)
Creatinine, Ser: 1.03 mg/dL — ABNORMAL HIGH (ref 0.44–1.00)
GFR, Estimated: 58 mL/min — ABNORMAL LOW (ref >60)
Glucose, Bld: 110 mg/dL — ABNORMAL HIGH (ref 70–99)
Potassium: 4.1 mmol/L (ref 3.5–5.1)
Sodium: 135 mmol/L (ref 135–145)

## 2023-06-29 NOTE — ED Triage Notes (Signed)
 Pt stated that she started having left flank pain since 1700 today accompanied by nausea and chills

## 2023-09-04 ENCOUNTER — Other Ambulatory Visit: Payer: Self-pay | Admitting: Nurse Practitioner

## 2023-09-04 ENCOUNTER — Other Ambulatory Visit: Payer: Self-pay | Admitting: Cardiovascular Disease

## 2023-09-04 DIAGNOSIS — K219 Gastro-esophageal reflux disease without esophagitis: Secondary | ICD-10-CM

## 2023-09-04 DIAGNOSIS — E785 Hyperlipidemia, unspecified: Secondary | ICD-10-CM

## 2023-09-04 NOTE — Telephone Encounter (Signed)
 Left message for pt to call back and make an appt if she needed this medicine refilled.

## 2023-09-04 NOTE — Telephone Encounter (Signed)
 MMM NTBS last OV 10/12/21 NO RF sent to pharmacy last OV greater than a year

## 2023-12-10 ENCOUNTER — Other Ambulatory Visit: Payer: Self-pay | Admitting: Cardiovascular Disease

## 2023-12-10 DIAGNOSIS — E785 Hyperlipidemia, unspecified: Secondary | ICD-10-CM

## 2024-01-15 ENCOUNTER — Other Ambulatory Visit: Payer: Self-pay | Admitting: Nurse Practitioner

## 2024-01-15 DIAGNOSIS — Z1231 Encounter for screening mammogram for malignant neoplasm of breast: Secondary | ICD-10-CM

## 2024-01-16 ENCOUNTER — Encounter: Payer: Self-pay | Admitting: Nurse Practitioner

## 2024-01-16 ENCOUNTER — Ambulatory Visit (INDEPENDENT_AMBULATORY_CARE_PROVIDER_SITE_OTHER): Admitting: Nurse Practitioner

## 2024-01-16 VITALS — BP 137/87 | HR 63 | Temp 96.9°F | Ht 67.0 in | Wt 200.0 lb

## 2024-01-16 DIAGNOSIS — J0100 Acute maxillary sinusitis, unspecified: Secondary | ICD-10-CM | POA: Diagnosis not present

## 2024-01-16 MED ORDER — DOXYCYCLINE HYCLATE 100 MG PO TABS: 100.0000 mg | ORAL_TABLET | Freq: Two times a day (BID) | ORAL | 0 refills | Status: AC

## 2024-01-16 NOTE — Patient Instructions (Signed)
1. Take meds as prescribed 2. Use a cool mist humidifier especially during the winter months and when heat has been humid. 3. Use saline nose sprays frequently 4. Saline irrigations of the nose can be very helpful if done frequently.  * 4X daily for 1 week*  * Use of a nettie pot can be helpful with this. Follow directions with this* 5. Drink plenty of fluids 6. Keep thermostat turn down low 7.For any cough or congestion- mucinex d  8. For fever or aces or pains- take tylenol or ibuprofen appropriate for age and weight.  * for fevers greater than 101 orally you may alternate ibuprofen and tylenol every  3 hours.

## 2024-01-16 NOTE — Progress Notes (Signed)
 Subjective:    Patient ID: Jasmine Stephens, female    DOB: Feb 03, 1951, 73 y.o.   MRN: [id number]   Chief Complaint: Sinus Problem   Sinus Problem This is a new problem. The current episode started in the past 7 days. The problem has been waxing and waning since onset. There has been no fever. Her pain is at a severity of 3/10. Associated symptoms include congestion and sinus pressure. Pertinent negatives include no chills or coughing. Past treatments include nothing. The treatment provided mild relief.    Patient Active Problem List   Diagnosis Date Noted   CAD S/P percutaneous coronary angioplasty 01-06-19   Dyslipidemia, goal LDL below 70 12/18/2018   Moderate obesity 12/18/2018   Prediabetes 12/18/2018   STEMI involving right coronary artery (HCC) 12/17/2018   Family history of malignant neoplasm of gastrointestinal tract 05/31/2013   Stricture and stenosis of esophagus 05/31/2013   Anxiety state 05/17/2010   Diverticulosis of colon 02/02/2009   History of colonic polyps 02/02/2009   GERD 08/09/2008   IRRITABLE BOWEL SYNDROME 08/09/2008   Dysphagia 08/09/2008       Review of Systems  Constitutional:  Negative for chills and fever.  HENT:  Positive for congestion and sinus pressure.   Respiratory:  Negative for cough.        Objective:   Physical Exam Constitutional:      Appearance: Normal appearance.  HENT:     Right Ear: Tympanic membrane normal.     Left Ear: Tympanic membrane normal.     Nose: Congestion and rhinorrhea present.     Right Sinus: Maxillary sinus tenderness present.     Left Sinus: Maxillary sinus tenderness present.  Cardiovascular:     Rate and Rhythm: Normal rate and regular rhythm.     Heart sounds: Normal heart sounds.  Pulmonary:     Breath sounds: Normal breath sounds.  Skin:    General: Skin is warm.  Neurological:     General: No focal deficit present.     Mental Status: She is alert and oriented to person, place, and time.   Psychiatric:        Mood and Affect: Mood normal.        Behavior: Behavior normal.    BP 137/87   Pulse 63   Temp (!) 96.9 F (36.1 C) (Temporal)   Ht 5' 7 (1.702 m)   Wt 200 lb (90.7 kg)   LMP  (LMP Unknown)   SpO2 95%   BMI 31.32 kg/m         Assessment & Plan:   Jasmine Stephens in today with chief complaint of Sinus Problem   1. Acute non-recurrent maxillary sinusitis (Primary) 1. Take meds as prescribed 2. Use a cool mist humidifier especially during the winter months and when heat has been humid. 3. Use saline nose sprays frequently 4. Saline irrigations of the nose can be very helpful if done frequently.  * 4X daily for 1 week*  * Use of a nettie pot can be helpful with this. Follow directions with this* 5. Drink plenty of fluids 6. Keep thermostat turn down low 7.For any cough or congestion- mucinex  D 8. For fever or aces or pains- take tylenol  or ibuprofen appropriate for age and weight.  * for fevers greater than 101 orally you may alternate ibuprofen and tylenol  every  3 hours.    - doxycycline  (VIBRA -TABS) 100 MG tablet; Take 1 tablet (100 mg total) by mouth 2 (two) times daily.  1 po bid  Dispense: 20 tablet; Refill: 0    The above assessment and management plan was discussed with the patient. The patient verbalized understanding of and has agreed to the management plan. Patient is aware to call the clinic if symptoms persist or worsen. Patient is aware when to return to the clinic for a follow-up visit. Patient educated on when it is appropriate to go to the emergency department.   Mary-Margaret Gladis, FNP

## 2024-02-09 ENCOUNTER — Ambulatory Visit: Attending: Cardiovascular Disease | Admitting: Cardiovascular Disease

## 2024-02-09 ENCOUNTER — Encounter: Payer: Self-pay | Admitting: Cardiovascular Disease

## 2024-02-09 VITALS — BP 136/74 | HR 63 | Ht 66.0 in | Wt 198.0 lb

## 2024-02-09 DIAGNOSIS — E785 Hyperlipidemia, unspecified: Secondary | ICD-10-CM

## 2024-02-09 DIAGNOSIS — I2510 Atherosclerotic heart disease of native coronary artery without angina pectoris: Secondary | ICD-10-CM | POA: Diagnosis not present

## 2024-02-09 DIAGNOSIS — I2111 ST elevation (STEMI) myocardial infarction involving right coronary artery: Secondary | ICD-10-CM | POA: Diagnosis not present

## 2024-02-09 DIAGNOSIS — Z9861 Coronary angioplasty status: Secondary | ICD-10-CM

## 2024-02-09 NOTE — Assessment & Plan Note (Signed)
 History of CAD status post inferior STEMI 12/13/2018 treated with PCI and drug-eluting eluding overlapping stents in her mid RCA by Dr. Mady with no other significant CAD.  She has remained asymptomatic.

## 2024-02-09 NOTE — Patient Instructions (Signed)
 Medication Instructions:  Your physician recommends that you continue on your current medications as directed. Please refer to the Current Medication list given to you today.  *If you need a refill on your cardiac medications before your next appointment, please call your pharmacy*  Lab Work: Today- Lipid/liver panel  If you have labs (blood work) drawn today and your tests are completely normal, you will receive your results only by: MyChart Message (if you have MyChart) OR A paper copy in the mail If you have any lab test that is abnormal or we need to change your treatment, we will call you to review the results.   Follow-Up: At [facility], you and your health needs are our priority.  As part of our continuing mission to provide you with exceptional heart care, our providers are all part of one team.  This team includes your primary Cardiologist (physician) and Advanced Practice Providers or APPs (Physician Assistants and Nurse Practitioners) who all work together to provide you with the care you need, when you need it.  Your next appointment:   12 month(s)  Provider:   Dorn Lesches, MD    We recommend signing up for the patient portal called MyChart.  Sign up information is provided on this After Visit Summary.  MyChart is used to connect with patients for Virtual Visits (Telemedicine).  Patients are able to view lab/test results, encounter notes, upcoming appointments, etc.  Non-urgent messages can be sent to your provider as well.   To learn more about what you can do with MyChart, go to [email or web address].

## 2024-02-09 NOTE — Progress Notes (Signed)
 02/09/2024 Jasmine Stephens   Jasmine Stephens 24, 1952  [id number]  Primary Physician Jasmine Mary-Margaret, FNP Primary Cardiologist: Jasmine Jasmine Lesches MD [facility], [place], [place]  HPI:  Jasmine Stephens is a 73 y.o.  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  Her mother was Jasmine Stephens, a patient of mine who died in Feb 28, 2007 .  I last saw her in the office 05/03/2022.   Her risk factors include treated hypertension hyperlipidemia.  She has a strong family history with a father who had myocardial infarction and died.  Mother had CAD and a brother had an MI in his 71s.  She had inferior STEMI 12/17/2018 treated with PCI and overlapping drug-eluting stents in her mid RCA by Dr. Mady with otherwise no significant remaining CAD and normal global LV function with an inferior wall motion abnormality.  She was discharged home 2 days later on low-dose aspirin , Brilinta , high-dose statin therapy, losartan  and beta-blocker.     Since I saw her in the office almost 2 years ago she continues to do well.  She is active and asymptomatic.   No outpatient medications have been marked as taking for the 02/09/24 encounter (Office Visit) with Stephens Jasmine JINNY, MD.     Allergies  Allergen Reactions   Compazine [Prochlorperazine] Anaphylaxis   Phenothiazines Anaphylaxis   Chlorpromazine Hcl Other (See Comments)    Reaction unknown   Penicillins Other (See Comments)    Reaction unkwown Did it involve swelling of the face/tongue/throat, SOB, or low BP? Unknown Did it involve sudden or severe rash/hives, skin peeling, or any reaction on the inside of your mouth or nose? Unknown Did you need to seek medical attention at a hospital or doctor's office? Unknown When did it last happen? unk       If all above answers are "NO", may proceed with cephalosporin use.    Latex Rash    With continued exposure to latex    Tape Rash   Tapentadol Rash    Social History   Socioeconomic History   Marital status: Married    Spouse name: Not on file   Number of children: 3   Years of education: Not on file   Highest education level: Not on file  Occupational History   Occupation: Retired  Tobacco Use   Smoking status: Never   Smokeless tobacco: Never  Vaping Use   Vaping status: Never Used  Substance and Sexual Activity   Alcohol use: Yes    Comment: occasional wine   Drug use: No   Sexual activity: Not on file  Other Topics Concern   Not on file  Social History Narrative   Lives in [place].    Social Drivers of Corporate Investment Banker Strain: Low Risk  (12/17/2018)   Overall Financial Resource Strain (CARDIA)    Difficulty of Paying Living Expenses: Not hard at all  Food Insecurity: Unknown (12/17/2018)   Hunger Vital Sign    Worried About Running Out of Food in the Last Year: Never true    Ran Out of Food in the Last Year: Not on file  Transportation Needs: Not on file  Physical Activity: Not on file  Stress: Not on file  Social Connections: Unknown (08/04/2021)   Received [facility]   Social Network    Social Network: Not on file  Intimate Partner Violence: Unknown (06/26/2021)   Received from  [facility]   HITS    Physically Hurt: Not on file    Insult or Talk Down To: Not on file    Threaten Physical Harm: Not on file    Scream or Curse: Not on file     Review of Systems: General: negative for chills, fever, night sweats or weight changes.  Cardiovascular: negative for chest pain, dyspnea on exertion, edema, orthopnea, palpitations, paroxysmal nocturnal dyspnea or shortness of breath Dermatological: negative for rash Respiratory: negative for cough or wheezing Urologic: negative for hematuria Abdominal: negative for nausea, vomiting, diarrhea, bright red blood per rectum, melena, or hematemesis Neurologic: negative for visual changes, syncope, or dizziness All other  systems reviewed and are otherwise negative except as noted above.    Blood pressure 136/74, pulse 63, height 5' 6 (1.676 m), weight 198 lb (89.8 kg), SpO2 97%.  General appearance: alert and no distress Neck: no adenopathy, no carotid bruit, no JVD, supple, symmetrical, trachea midline, and thyroid  not enlarged, symmetric, no tenderness/mass/nodules Lungs: clear to auscultation bilaterally Heart: regular rate and rhythm, S1, S2 normal, no murmur, click, rub or gallop Extremities: extremities normal, atraumatic, no cyanosis or edema Pulses: 2+ and symmetric Skin: Skin color, texture, turgor normal. No rashes or lesions Neurologic: Grossly normal  EKG EKG Interpretation Date/Time:  Monday February 09 2024 11:58:15 EST Ventricular Rate:  63 PR Interval:  156 QRS Duration:  90 QT Interval:  406 QTC Calculation: 415 R Axis:   4  Text Interpretation: Normal sinus rhythm with sinus arrhythmia Possible Left atrial enlargement When compared with ECG of 15-Jan-2019 06:59, T wave inversion less evident in Inferior leads Nonspecific T wave abnormality no longer evident in Anterior leads Confirmed by Stephens Carrier [phone]) on 02/09/2024 11:59:31 AM    ASSESSMENT AND PLAN:   Dyslipidemia, goal LDL below 70 History of hyperlipidemia on high-dose atorvastatin  with lipid profile performed 05/03/2022 revealing total cholesterol 143, LDL of 69 HDL 54.  We will repeat a lipid liver profile today.  STEMI involving right coronary artery [facility]) History of CAD status post inferior STEMI 12/13/2018 treated with PCI and drug-eluting eluding overlapping stents in her mid RCA by Dr. Mady with no other significant CAD.  She has remained asymptomatic.     Carrier Jasmine Court MD FACP,FACC,FAHA, [facility] 02/09/2024 12:07 PM

## 2024-02-09 NOTE — Assessment & Plan Note (Signed)
 History of hyperlipidemia on high-dose atorvastatin  with lipid profile performed 05/03/2022 revealing total cholesterol 143, LDL of 69 HDL 54.  We will repeat a lipid liver profile today.

## 2024-02-10 ENCOUNTER — Ambulatory Visit: Payer: Self-pay | Admitting: Cardiovascular Disease

## 2024-02-10 LAB — LIPID PANEL
Chol/HDL Ratio: 2.7 ratio (ref 0.0–4.4)
Cholesterol, Total: 165 mg/dL (ref 100–199)
HDL: 61 mg/dL (ref >39)
LDL Chol Calc (NIH): 88 mg/dL (ref 0–99)
Triglycerides: 89 mg/dL (ref 0–149)
VLDL Cholesterol Cal: 16 mg/dL (ref 5–40)

## 2024-02-10 LAB — HEPATIC FUNCTION PANEL
ALT: 40 IU/L — ABNORMAL HIGH (ref 0–32)
AST: 40 IU/L (ref 0–40)
Albumin: 4.4 g/dL (ref 3.8–4.8)
Alkaline Phosphatase: 100 IU/L (ref 49–135)
Bilirubin Total: 0.4 mg/dL (ref 0.0–1.2)
Bilirubin, Direct: 0.14 mg/dL (ref 0.00–0.40)
Total Protein: 7.6 g/dL (ref 6.0–8.5)

## 2024-02-18 ENCOUNTER — Ambulatory Visit

## 2024-03-23 ENCOUNTER — Other Ambulatory Visit: Payer: Self-pay | Admitting: Cardiovascular Disease

## 2024-03-23 ENCOUNTER — Other Ambulatory Visit: Payer: Self-pay | Admitting: Nurse Practitioner

## 2024-03-23 DIAGNOSIS — K219 Gastro-esophageal reflux disease without esophagitis: Secondary | ICD-10-CM

## 2024-03-23 DIAGNOSIS — E785 Hyperlipidemia, unspecified: Secondary | ICD-10-CM

## 2024-04-14 ENCOUNTER — Ambulatory Visit
